# Patient Record
Sex: Female | Born: 1985
Health system: Southern US, Community
[De-identification: ages and names within clinical notes are randomized; demographics above are authoritative.]

## PROBLEM LIST (undated history)

## (undated) DIAGNOSIS — R102 Pelvic and perineal pain unspecified side: Secondary | ICD-10-CM

## (undated) DIAGNOSIS — F32A Depression, unspecified: Secondary | ICD-10-CM

## (undated) DIAGNOSIS — N939 Abnormal uterine and vaginal bleeding, unspecified: Secondary | ICD-10-CM

## (undated) DIAGNOSIS — G4730 Sleep apnea, unspecified: Secondary | ICD-10-CM

## (undated) DIAGNOSIS — G4733 Obstructive sleep apnea (adult) (pediatric): Secondary | ICD-10-CM

## (undated) DIAGNOSIS — F419 Anxiety disorder, unspecified: Secondary | ICD-10-CM

## (undated) DIAGNOSIS — N870 Mild cervical dysplasia: Secondary | ICD-10-CM

## (undated) DIAGNOSIS — D649 Anemia, unspecified: Secondary | ICD-10-CM

## (undated) DIAGNOSIS — D251 Intramural leiomyoma of uterus: Secondary | ICD-10-CM

## (undated) DIAGNOSIS — Z9889 Other specified postprocedural states: Secondary | ICD-10-CM

## (undated) HISTORY — DX: Depression, unspecified: F32.A

## (undated) HISTORY — DX: Sleep apnea, unspecified: G47.30

## (undated) HISTORY — DX: Anxiety disorder, unspecified: F41.9

## (undated) HISTORY — PX: DENTAL RESTORATION/EXTRACTION WITH X-RAY: SHX5796

---

## 2010-09-03 HISTORY — PX: TUBAL LIGATION: SHX77

## 2010-12-13 ENCOUNTER — Observation Stay: Payer: Self-pay | Admitting: Obstetrics & Gynecology

## 2010-12-20 ENCOUNTER — Inpatient Hospital Stay: Payer: Self-pay

## 2010-12-25 LAB — PATHOLOGY REPORT

## 2011-03-15 ENCOUNTER — Emergency Department: Payer: Self-pay | Admitting: Emergency Medicine

## 2013-05-19 ENCOUNTER — Emergency Department: Payer: Self-pay | Admitting: Emergency Medicine

## 2013-05-30 ENCOUNTER — Emergency Department: Payer: Self-pay | Admitting: Emergency Medicine

## 2014-12-24 ENCOUNTER — Emergency Department
Admit: 2014-12-24 | Disposition: A | Discharge status: Home / Self Care | Payer: Self-pay | Admitting: Emergency Medicine

## 2014-12-24 LAB — URINALYSIS, COMPLETE
BILIRUBIN, UR: NEGATIVE
Glucose,UR: NEGATIVE mg/dL (ref 0–75)
KETONE: NEGATIVE
Nitrite: NEGATIVE
PROTEIN: NEGATIVE
Ph: 7 (ref 4.5–8.0)
SPECIFIC GRAVITY: 1.016 (ref 1.003–1.030)

## 2014-12-24 LAB — PREGNANCY, URINE: PREGNANCY TEST, URINE: NEGATIVE m[IU]/mL

## 2014-12-24 LAB — WET PREP, GENITAL

## 2014-12-25 LAB — GC/CHLAMYDIA PROBE AMP

## 2016-03-15 ENCOUNTER — Encounter: Payer: Self-pay | Admitting: Emergency Medicine

## 2016-03-15 ENCOUNTER — Emergency Department
Admission: EM | Admit: 2016-03-15 | Discharge: 2016-03-15 | Disposition: A | Discharge status: Home / Self Care | Payer: Managed Care, Other (non HMO) | Attending: Emergency Medicine | Admitting: Emergency Medicine

## 2016-03-15 DIAGNOSIS — F17210 Nicotine dependence, cigarettes, uncomplicated: Secondary | ICD-10-CM | POA: Insufficient documentation

## 2016-03-15 DIAGNOSIS — N938 Other specified abnormal uterine and vaginal bleeding: Secondary | ICD-10-CM | POA: Insufficient documentation

## 2016-03-15 DIAGNOSIS — N939 Abnormal uterine and vaginal bleeding, unspecified: Secondary | ICD-10-CM | POA: Diagnosis present

## 2016-03-15 LAB — COMPREHENSIVE METABOLIC PANEL
ALBUMIN: 3.9 g/dL (ref 3.5–5.0)
ALK PHOS: 81 U/L (ref 38–126)
ALT: 13 U/L — AB (ref 14–54)
AST: 19 U/L (ref 15–41)
Anion gap: 6 (ref 5–15)
BUN: 12 mg/dL (ref 6–20)
CALCIUM: 9 mg/dL (ref 8.9–10.3)
CO2: 28 mmol/L (ref 22–32)
CREATININE: 0.69 mg/dL (ref 0.44–1.00)
Chloride: 106 mmol/L (ref 101–111)
GFR calc Af Amer: >60 mL/min (ref >60)
GFR calc non Af Amer: >60 mL/min (ref >60)
GLUCOSE: 89 mg/dL (ref 65–99)
Potassium: 3.6 mmol/L (ref 3.5–5.1)
SODIUM: 140 mmol/L (ref 135–145)
Total Bilirubin: 0.3 mg/dL (ref 0.3–1.2)
Total Protein: 6.7 g/dL (ref 6.5–8.1)

## 2016-03-15 LAB — URINALYSIS COMPLETE WITH MICROSCOPIC (ARMC ONLY)
Bacteria, UA: NONE SEEN
Bilirubin Urine: NEGATIVE
Glucose, UA: NEGATIVE mg/dL
KETONES UR: NEGATIVE mg/dL
Leukocytes, UA: NEGATIVE
Nitrite: NEGATIVE
PROTEIN: NEGATIVE mg/dL
Specific Gravity, Urine: 1.019 (ref 1.005–1.030)
WBC UA: NONE SEEN WBC/hpf (ref 0–5)
pH: 6 (ref 5.0–8.0)

## 2016-03-15 LAB — LIPASE, BLOOD: Lipase: 21 U/L (ref 11–51)

## 2016-03-15 LAB — CBC
HCT: 37.8 % (ref 35.0–47.0)
HEMOGLOBIN: 12.4 g/dL (ref 12.0–16.0)
MCH: 28.6 pg (ref 26.0–34.0)
MCHC: 32.9 g/dL (ref 32.0–36.0)
MCV: 86.8 fL (ref 80.0–100.0)
PLATELETS: 296 10*3/uL (ref 150–440)
RBC: 4.35 MIL/uL (ref 3.80–5.20)
RDW: 15.4 % — AB (ref 11.5–14.5)
WBC: 11 10*3/uL (ref 3.6–11.0)

## 2016-03-15 LAB — POCT PREGNANCY, URINE: Preg Test, Ur: NEGATIVE

## 2016-03-15 NOTE — ED Notes (Addendum)
Pt ambulatory to triage with steady gait, pt reports having mid abdominal pain today and spotting today. Pt reports LMP was 03/09/16. States fell on Monday, pt states unsure if symptoms related to fall. Pt denies urinary symptoms, nausea or vomiting. Pt alert and oriented x 4, no increased work in breathing noted.

## 2016-03-15 NOTE — ED Provider Notes (Signed)
Time Seen: Approximately 2030  I have reviewed the triage notes  Chief Complaint: Abdominal Pain and Vaginal Bleeding   History of Present Illness: Lynn Zuniga is a 30 y.o. female *who apparently had a slip and fall and was seen at an urgent care center and was diagnosed with a fracture of her coccyx. Patient was concerned today because she had some lower middle quadrant abdominal pain and some vaginal spotting. She states her menstrual period was 03/09/16. She denies any heavy vaginal bleeding. She denies any dysuria, hematuria or urinary frequency. She has had normal bowel movements. Pain in her lower back mainly with movement which is unchanged. She denies any weakness in her lower extremities.  History reviewed. No pertinent past medical history.  There are no active problems to display for this patient.   Past Surgical History  Procedure Laterality Date  . Tubal ligation      Past Surgical History  Procedure Laterality Date  . Tubal ligation      No current outpatient prescriptions on file.  Allergies:  Review of patient's allergies indicates no known allergies.  Family History: No family history on file.  Social History: Social History  Substance Use Topics  . Smoking status: Current Every Day Smoker -- 0.50 packs/day    Types: Cigarettes  . Smokeless tobacco: None  . Alcohol Use: Yes     Comment: rarely     Review of Systems:   10 point review of systems was performed and was otherwise negative:  Constitutional: No fever Eyes: No visual disturbances ENT: No sore throat, ear pain Cardiac: No chest pain Respiratory: No shortness of breath, wheezing, or stridor Abdomen: No abdominal pain, no vomiting, No diarrhea Endocrine: No weight loss, No night sweats Extremities: No peripheral edema, cyanosis Skin: No rashes, easy bruising Neurologic: No focal weakness, trouble with speech or swollowing Urologic: No dysuria, Hematuria, or urinary  frequency Patient denies any rectal bleeding  Physical Exam:  ED Triage Vitals  Enc Vitals Group     BP 03/15/16 1927 125/67 mmHg     Pulse Rate 03/15/16 1927 79     Resp 03/15/16 1927 18     Temp 03/15/16 1927 98.6 F (37 C)     Temp Source 03/15/16 1927 Oral     SpO2 03/15/16 1927 100 %     Weight 03/15/16 1927 185 lb (83.915 kg)     Height 03/15/16 1927 5\' 5"  (1.651 m)     Head Cir --      Peak Flow --      Pain Score 03/15/16 1927 5     Pain Loc --      Pain Edu? --      Excl. in GC? --     General: Awake , Alert , and Oriented times 3; GCS 15 Head: Normal cephalic , atraumatic Eyes: Pupils equal , round, reactive to light Nose/Throat: No nasal drainage, patent upper airway without erythema or exudate.  Neck: Supple, Full range of motion, No anterior adenopathy or palpable thyroid masses Lungs: Clear to ascultation without wheezes , rhonchi, or rales Heart: Regular rate, regular rhythm without murmurs , gallops , or rubs Abdomen: Soft, non tender without rebound, guarding , or rigidity; bowel sounds positive and symmetric in all 4 quadrants. No organomegaly .        Extremities: 2 plus symmetric pulses. No edema, clubbing or cyanosis Neurologic: normal ambulation, Motor symmetric without deficits, sensory intact Skin: warm, dry, no rashes  Labs:   All laboratory work was reviewed including any pertinent negatives or positives listed below:  Labs Reviewed  COMPREHENSIVE METABOLIC PANEL - Abnormal; Notable for the following:    ALT 13 (*)    All other components within normal limits  CBC - Abnormal; Notable for the following:    RDW 15.4 (*)    All other components within normal limits  URINALYSIS COMPLETEWITH MICROSCOPIC ([facility] ONLY) - Abnormal; Notable for the following:    Color, Urine YELLOW (*)    APPearance CLEAR (*)    Hgb urine dipstick 2+ (*)    Squamous Epithelial / LPF 0-5 (*)    All other components within normal limits  LIPASE, BLOOD  POC URINE  PREG, ED  POCT PREGNANCY, URINE    ED Course:  I felt was unlikely the patient had vaginal bleeding related to her fall. I felt these were 2 separate entities at this time. She denies any rectal bleeding which would be of concern if the patient fell injuring her coccyx that would cause the bone to drive through into the rectal area. This will take a pretty significant mechanism of her fall. I felt that that was unlikely. Patient's been up and ambulatory without difficulty and doesn't appear to have significant reproducible abdominal pain and I felt this was unlikely to be a surgical abdomen at this point.  Assessment:  Dysfunctional uterine bleeding Stated history of a coccyx fracture  Final Clinical Impression:   Final diagnoses:  DUB (dysfunctional uterine bleeding)     Plan:  Outpatient Patient was advised to return immediately if condition worsens. Patient was advised to follow up with their primary care physician or other specialized physicians involved in their outpatient care. The patient and/or family member/power of attorney had laboratory results reviewed at the bedside. All questions and concerns were addressed and appropriate discharge instructions were distributed by the nursing staff. Patient was advised take stool softeners and has prescription pain medication. She has been taking oxycodone and nonsteroidal medication which may actually be causing some of the abdominal discomfort            Jennye Moccasin, MD 03/15/16 2213

## 2016-03-15 NOTE — Discharge Instructions (Signed)
Abnormal Uterine Bleeding °Abnormal uterine bleeding can affect women at various stages in life, including teenagers, women in their reproductive years, pregnant women, and women who have reached menopause. Several kinds of uterine bleeding are considered abnormal, including: °· Bleeding or spotting between periods.   °· Bleeding after sexual intercourse.   °· Bleeding that is heavier or more than normal.   °· Periods that last longer than usual. °· Bleeding after menopause.   °Many cases of abnormal uterine bleeding are minor and simple to treat, while others are more serious. Any type of abnormal bleeding should be evaluated by your health care provider. Treatment will depend on the cause of the bleeding. °HOME CARE INSTRUCTIONS °Monitor your condition for any changes. The following actions may help to alleviate any discomfort you are experiencing: °· Avoid the use of tampons and douches as directed by your health care provider. °· Change your pads frequently. °You should get regular pelvic exams and Pap tests. Keep all follow-up appointments for diagnostic tests as directed by your health care provider.  °SEEK MEDICAL CARE IF:  °· Your bleeding lasts more than 1 week.   °· You feel dizzy at times.   °SEEK IMMEDIATE MEDICAL CARE IF:  °· You pass out.   °· You are changing pads every 15 to 30 minutes.   °· You have abdominal pain. °· You have a fever.   °· You become sweaty or weak.   °· You are passing large blood clots from the vagina.   °· You start to feel nauseous and vomit. °MAKE SURE YOU:  °· Understand these instructions. °· Will watch your condition. °· Will get help right away if you are not doing well or get worse. °  °This information is not intended to replace advice given to you by your health care provider. Make sure you discuss any questions you have with your health care provider. °  °Document Released: 08/20/2005 Document Revised: 08/25/2013 Document Reviewed: 03/19/2013 °Elsevier Interactive  Patient Education ©2016 [employer] ° °Please return immediately if condition worsens. Please contact her primary physician or the physician you were given for referral. If you have any specialist physicians involved in her treatment and plan please also contact them. Thank you for using [facility]. ° °

## 2016-03-21 ENCOUNTER — Encounter: Payer: Self-pay | Admitting: *Deleted

## 2016-03-21 ENCOUNTER — Emergency Department
Admission: EM | Admit: 2016-03-21 | Discharge: 2016-03-21 | Disposition: A | Discharge status: Home / Self Care | Payer: Managed Care, Other (non HMO) | Attending: Emergency Medicine | Admitting: Emergency Medicine

## 2016-03-21 ENCOUNTER — Emergency Department: Payer: Managed Care, Other (non HMO)

## 2016-03-21 DIAGNOSIS — Z791 Long term (current) use of non-steroidal anti-inflammatories (NSAID): Secondary | ICD-10-CM | POA: Insufficient documentation

## 2016-03-21 DIAGNOSIS — N939 Abnormal uterine and vaginal bleeding, unspecified: Secondary | ICD-10-CM | POA: Diagnosis not present

## 2016-03-21 DIAGNOSIS — F17210 Nicotine dependence, cigarettes, uncomplicated: Secondary | ICD-10-CM | POA: Diagnosis not present

## 2016-03-21 DIAGNOSIS — Z79899 Other long term (current) drug therapy: Secondary | ICD-10-CM | POA: Insufficient documentation

## 2016-03-21 DIAGNOSIS — R102 Pelvic and perineal pain: Secondary | ICD-10-CM | POA: Diagnosis present

## 2016-03-21 LAB — CBC
HCT: 35.4 % (ref 35.0–47.0)
Hemoglobin: 12.4 g/dL (ref 12.0–16.0)
MCH: 29.5 pg (ref 26.0–34.0)
MCHC: 35 g/dL (ref 32.0–36.0)
MCV: 84.4 fL (ref 80.0–100.0)
PLATELETS: 273 10*3/uL (ref 150–440)
RBC: 4.19 MIL/uL (ref 3.80–5.20)
RDW: 15.4 % — ABNORMAL HIGH (ref 11.5–14.5)
WBC: 9.1 10*3/uL (ref 3.6–11.0)

## 2016-03-21 LAB — POCT PREGNANCY, URINE: PREG TEST UR: NEGATIVE

## 2016-03-21 MED ORDER — MEDROXYPROGESTERONE ACETATE 10 MG PO TABS: 10.0000 mg | ORAL_TABLET | Freq: Every day | ORAL | Status: DC

## 2016-03-21 NOTE — ED Notes (Signed)
States she was seen last week with vaginal bleeding, states she is passing clots, denies any dizziness

## 2016-03-21 NOTE — Discharge Instructions (Signed)

## 2016-03-21 NOTE — ED Provider Notes (Signed)
[facility] Emergency Department Provider Note        Time seen: ----------------------------------------- 10:27 AM on 03/21/2016 -----------------------------------------    I have reviewed the triage vital signs and the nursing notes.   HISTORY  Chief Complaint Abdominal Pain and Vaginal Bleeding    HPI Lynn Zuniga is a 30 y.o. female who presents to ER for persistent abdominal pain and vaginal bleeding. Patient states she was recently seen for a coccyx fracture. She followed up with orthopedist recommended soft padding and pain medicine as needed. Patient states whenever she lies on her stomach she been passing clots. Patient states she had a menstrual cycle already this month but after the fall he began having bleeding again.   History reviewed. No pertinent past medical history.  There are no active problems to display for this patient.   Past Surgical History  Procedure Laterality Date  . Tubal ligation      Allergies Review of patient's allergies indicates no known allergies.  Social History Social History  Substance Use Topics  . Smoking status: Current Every Day Smoker -- 0.50 packs/day    Types: Cigarettes  . Smokeless tobacco: None  . Alcohol Use: Yes     Comment: rarely    Review of Systems Constitutional: Negative for fever. Cardiovascular: Negative for chest pain. Respiratory: Negative for shortness of breath. Gastrointestinal: Positive for abdominal pain Genitourinary: Positive for vaginal bleeding Musculoskeletal: Positive for pain in her tailbone Skin: Negative for rash. Neurological: Negative for headaches, focal weakness or numbness.  10-point ROS otherwise negative.  ____________________________________________   PHYSICAL EXAM:  VITAL SIGNS: ED Triage Vitals  Enc Vitals Group     BP 03/21/16 0954 124/74 mmHg     Pulse Rate 03/21/16 0954 82     Resp 03/21/16 0954 18     Temp 03/21/16 0954 98.4 F  (36.9 C)     Temp Source 03/21/16 0954 Oral     SpO2 03/21/16 0954 99 %     Weight 03/21/16 0954 185 lb (83.915 kg)     Height 03/21/16 0954  (1.651 m)     Head Cir --      Peak Flow --      Pain Score 03/21/16 0956 0     Pain Loc --      Pain Edu? --      Excl. in GC? --     Constitutional: Alert and oriented. Well appearing and in no distress. Eyes: Conjunctivae are normal. Normal extraocular movements. Cardiovascular: Normal rate, regular rhythm. No murmurs, rubs, or gallops. Respiratory: Normal respiratory effort without tachypnea nor retractions. Breath sounds are clear and equal bilaterally. No wheezes/rales/rhonchi. Gastrointestinal: Soft and nontender. Normal bowel sounds Musculoskeletal: Nontender with normal range of motion in all extremities. No lower extremity tenderness nor edema. Neurologic:  Normal speech and language. No gross focal neurologic deficits are appreciated.  Skin:  Skin is warm, dry and intact. No rash noted. Psychiatric: Mood and affect are normal. Speech and behavior are normal.  ____________________________________________  ED COURSE:  Pertinent labs & imaging results that were available during my care of the patient were reviewed by me and considered in my medical decision making (see chart for details). Patient is no acute distress, will check basic labs and likely ultrasound imaging. ____________________________________________    LABS (pertinent positives/negatives)  Labs Reviewed  CBC - Abnormal; Notable for the following:    RDW 15.4 (*)    All other components within normal limits  POCT PREGNANCY, URINE    RADIOLOGY  Pelvic ultrasound IMPRESSION: 1. Endometrial thickness is within normal limits for age. No focal endometrial abnormality identified. 2. Small anterior uterine fibroid. 3. Simple and mildly complex left ovarian cysts. No specific follow-up of these lesions is necessary. 4. No evidence of ovarian  torsion. ____________________________________________  FINAL ASSESSMENT AND PLAN  Abnormal vaginal bleeding  Plan: Patient with labs and imaging as dictated above. Patient with unremarkable ultrasound here. She will be given Provera and referred to GYN for outpatient follow-up.   Emily FilbertWilliams, Shekira Drummer E, MD   Note: This dictation was prepared with Dragon dictation. Any transcriptional errors that result from this process are unintentional   Emily FilbertJonathan E Jovi Zavadil, MD 03/21/16 1207

## 2016-03-21 NOTE — ED Notes (Signed)
Pt presents with abd pain and vaginal bleeding. Previous broken tailbone. Was seen here last week for same.

## 2016-04-02 DIAGNOSIS — S300XXA Contusion of lower back and pelvis, initial encounter: Secondary | ICD-10-CM | POA: Insufficient documentation

## 2016-11-19 ENCOUNTER — Ambulatory Visit (INDEPENDENT_AMBULATORY_CARE_PROVIDER_SITE_OTHER): Payer: Managed Care, Other (non HMO) | Admitting: Obstetrics & Gynecology

## 2016-11-19 ENCOUNTER — Encounter: Payer: Self-pay | Admitting: Obstetrics & Gynecology

## 2016-11-19 VITALS — BP 120/80 | HR 77 | Ht 66.0 in | Wt 184.0 lb

## 2016-11-19 DIAGNOSIS — N879 Dysplasia of cervix uteri, unspecified: Secondary | ICD-10-CM | POA: Insufficient documentation

## 2016-11-19 DIAGNOSIS — N920 Excessive and frequent menstruation with regular cycle: Secondary | ICD-10-CM | POA: Diagnosis not present

## 2016-11-19 HISTORY — DX: Excessive and frequent menstruation with regular cycle: N92.0

## 2016-11-19 NOTE — Progress Notes (Signed)
  Pt is a 31 yo [id number] with prior LGSIL and COlpo w Bx showing CIN I 6 mos ago.  No c/os.  Here for f/u.  Also has reg periods but some are more heavy than others; wearing tampon and pad hourly for a few days.  Interferes w functioning. Assoc w cramps.  Motrin mild relief.   PMHx: She  has no past medical history on file. Also,  has a past surgical history that includes Tubal ligation., family history includes Lung cancer in her maternal aunt.,  reports that she has been smoking Cigarettes.  She has been smoking about 0.50 packs per day. She has never used smokeless tobacco. She reports that she drinks alcohol.  She has a current medication list which includes the following prescription(s): medroxyprogesterone, naproxen, and oxycodone-acetaminophen. Also, has No Known Allergies.  Review of Systems  Constitutional: Negative for chills, fever and malaise/fatigue.  HENT: Negative for congestion, sinus pain and sore throat.   Eyes: Negative for blurred vision and pain.  Respiratory: Negative for cough and wheezing.   Cardiovascular: Negative for chest pain and leg swelling.  Gastrointestinal: Negative for abdominal pain, constipation, diarrhea, heartburn, nausea and vomiting.  Genitourinary: Negative for dysuria, frequency, hematuria and urgency.  Musculoskeletal: Negative for back pain, joint pain, myalgias and neck pain.  Skin: Negative for itching and rash.  Neurological: Negative for dizziness, tremors and weakness.  Endo/Heme/Allergies: Does not bruise/bleed easily.  Psychiatric/Behavioral: Negative for depression. The patient is not nervous/anxious and does not have insomnia.     Objective: BP 120/80   Pulse 77   Ht 5\' 6"  (1.676 m)   Wt 184 lb (83.5 kg)   LMP 10/28/2016   BMI 29.70 kg/m  Physical Exam  Constitutional: She is oriented to person, place, and time. She appears well-developed and well-nourished. No distress.  Genitourinary: Rectum normal and vagina normal. Pelvic exam  was performed with patient supine. There is no rash or lesion on the right labia. There is no rash or lesion on the left labia. Vagina exhibits no lesion. No bleeding in the vagina.  Cardiovascular: Normal rate.   Pulmonary/Chest: Effort normal.  Abdominal: Soft. Bowel sounds are normal. She exhibits no distension. There is no tenderness. There is no rebound.  Musculoskeletal: Normal range of motion.  Neurological: She is alert and oriented to person, place, and time.  Skin: Skin is warm and dry.  Psychiatric: She has a normal mood and affect.  Vitals reviewed.   ASSESSMENT/PLAN:    Problem List Items Addressed This Visit      Genitourinary   Cervical dysplasia - Primary   Relevant Orders   IGP, Aptima HPV     Other   Menorrhagia with regular cycle    Patient has abnormal uterine bleeding . She has a normal exam today, with no evidence of lesions. Future Evaluation includes the following: exam, labs such as hormonal testing, and pelvic ultrasound to evaluate for any structural gynecologic abnormalities.  She has had BTL.  Will consider.  Treatment option for menorrhagia or menometrorrhagia discussed in great detail with the patient.  Options include hormonal therapy, IUD therapy such as Mirena, D&C, Ablation, and Hysterectomy.  The pros and cons of each option discussed with patient.  PAP 6 mos.  Annamarie MajorPaul Harris, MD, Merlinda FrederickFACOG [facility], [facility] 11/19/2016  11:21 AM

## 2016-11-22 LAB — IGP, APTIMA HPV
HPV APTIMA: NEGATIVE
PAP SMEAR COMMENT: 0

## 2017-06-09 ENCOUNTER — Emergency Department
Admission: EM | Admit: 2017-06-09 | Discharge: 2017-06-09 | Disposition: A | Discharge status: Home / Self Care | Payer: Managed Care, Other (non HMO) | Attending: Emergency Medicine | Admitting: Emergency Medicine

## 2017-06-09 DIAGNOSIS — F17210 Nicotine dependence, cigarettes, uncomplicated: Secondary | ICD-10-CM | POA: Diagnosis not present

## 2017-06-09 DIAGNOSIS — K047 Periapical abscess without sinus: Secondary | ICD-10-CM | POA: Diagnosis not present

## 2017-06-09 DIAGNOSIS — K0889 Other specified disorders of teeth and supporting structures: Secondary | ICD-10-CM | POA: Diagnosis present

## 2017-06-09 MED ORDER — DEXAMETHASONE SODIUM PHOSPHATE 10 MG/ML IJ SOLN: 10.0000 mg | Freq: Once | INTRAMUSCULAR | Status: DC

## 2017-06-09 MED ORDER — LIDOCAINE VISCOUS 2 % MT SOLN: 10.0000 mL | OROMUCOSAL | 0 refills | Status: DC | PRN

## 2017-06-09 MED ORDER — DEXAMETHASONE SODIUM PHOSPHATE 10 MG/ML IJ SOLN
10.0000 mg | Freq: Once | INTRAMUSCULAR | Status: AC
  Administered 2017-06-09: 10 mg via INTRAMUSCULAR
  Filled 2017-06-09: qty 1

## 2017-06-09 MED ORDER — LIDOCAINE VISCOUS 2 % MT SOLN
15.0000 mL | Freq: Once | OROMUCOSAL | Status: AC
  Administered 2017-06-09: 15 mL via OROMUCOSAL
  Filled 2017-06-09: qty 15

## 2017-06-09 MED ORDER — CLINDAMYCIN PHOSPHATE 600 MG/50ML IV SOLN: 600.0000 mg | Freq: Once | INTRAVENOUS | Status: DC

## 2017-06-09 MED ORDER — CLINDAMYCIN PHOSPHATE 300 MG/2ML IJ SOLN
INTRAMUSCULAR | Status: AC
  Filled 2017-06-09: qty 4

## 2017-06-09 MED ORDER — CLINDAMYCIN PHOSPHATE 600 MG/4ML IJ SOLN
600.0000 mg | Freq: Once | INTRAMUSCULAR | Status: AC
  Administered 2017-06-09: 600 mg via INTRAMUSCULAR
  Filled 2017-06-09: qty 4

## 2017-06-09 MED ORDER — CLINDAMYCIN HCL 300 MG PO CAPS: 300.0000 mg | ORAL_CAPSULE | Freq: Three times a day (TID) | ORAL | 0 refills | Status: AC

## 2017-06-09 MED ORDER — IBUPROFEN 600 MG PO TABS: 600.0000 mg | ORAL_TABLET | Freq: Four times a day (QID) | ORAL | 0 refills | Status: DC | PRN

## 2017-06-09 MED ORDER — OXYCODONE-ACETAMINOPHEN 5-325 MG PO TABS
1.0000 | ORAL_TABLET | Freq: Once | ORAL | Status: AC
  Administered 2017-06-09: 1 via ORAL
  Filled 2017-06-09: qty 1

## 2017-06-09 NOTE — ED Triage Notes (Signed)
Pt c/o left sided dental pain since yesterday, unable to see a dentist. VS stable.

## 2017-06-09 NOTE — ED Provider Notes (Signed)
[facility] Emergency Department Provider Note  ____________________________________________  Time seen: Approximately 12:41 PM  I have reviewed the triage vital signs and the nursing notes.   HISTORY  Chief Complaint Dental Pain    HPI Lynn Zuniga is a 31 y.o. female that presents to emergency department for evaluation of left-sided dental pain for two days. She feels like it is swollen. She does not have a dentist. No fever, shortness of breath, chest pain, nausea, vomiting, abdominal pain.   History reviewed. No pertinent past medical history.  Patient Active Problem List   Diagnosis Date Noted  . Cervical dysplasia 11/19/2016  . Menorrhagia with regular cycle 11/19/2016    Past Surgical History:  Procedure Laterality Date  . TUBAL LIGATION      Prior to Admission medications   Medication Sig Start Date End Date Taking? Authorizing Provider  clindamycin (CLEOCIN) 300 MG capsule Take 1 capsule (300 mg total) by mouth 3 (three) times daily. 06/09/17 06/19/17  Enid Derry, PA-C  ibuprofen (ADVIL,MOTRIN) 600 MG tablet Take 1 tablet (600 mg total) by mouth every 6 (six) hours as needed. 06/09/17   Enid Derry, PA-C  lidocaine (XYLOCAINE) 2 % solution Use as directed 10 mLs in the mouth or throat as needed for mouth pain. 06/09/17   Enid Derry, PA-C  medroxyPROGESTERone (PROVERA) 10 MG tablet Take 1 tablet (10 mg total) by mouth daily. Patient not taking: Reported on 11/19/2016 03/21/16   Emily Filbert, MD  naproxen (NAPROSYN) 500 MG tablet Take 500 mg by mouth 2 (two) times daily with a meal.    [provider]  oxyCODONE-acetaminophen (PERCOCET/ROXICET) 5-325 MG tablet Take 1 tablet by mouth every 6 (six) hours as needed for severe pain.    [provider]    Allergies Patient has no known allergies.  Family History  Problem Relation Age of Onset  . Lung cancer Maternal Aunt     Social History Social History   Substance Use Topics  . Smoking status: Current Every Day Smoker    Packs/day: 0.50    Types: Cigarettes  . Smokeless tobacco: Never Used  . Alcohol use Yes     Comment: rarely     Review of Systems  Constitutional: No fever/chills Cardiovascular: No chest pain. Respiratory: No SOB. Gastrointestinal: No abdominal pain.  No nausea, no vomiting.  Musculoskeletal: Negative for musculoskeletal pain. Skin: Negative for rash, abrasions, lacerations, ecchymosis. Neurological: Negative for headaches, numbness or tingling   ____________________________________________   PHYSICAL EXAM:  VITAL SIGNS: ED Triage Vitals [06/09/17 1053]  Enc Vitals Group     BP (!) 148/63     Pulse Rate 77     Resp 16     Temp 98.6 F (37 C)     Temp Source Oral     SpO2 100 %     Weight 185 lb (83.9 kg)     Height  (1.676 m)     Head Circumference      Peak Flow      Pain Score      Pain Loc      Pain Edu?      Excl. in GC?      Constitutional: Alert and oriented. Well appearing and in no acute distress. Eyes: Conjunctivae are normal. PERRL. EOMI. Head: Atraumatic. ENT:      Ears:      Nose: No congestion/rhinnorhea.      Mouth/Throat: Mucous membranes are moist. Tenderness to palpation over left  lower lateral incisor. No visible dental decay. No visible swelling. No drainage from mouth. No TMJ pain. No difficulty opening and closing mouth. Neck: No stridor.  Cardiovascular: Normal rate, regular rhythm.  Good peripheral circulation. Respiratory: Normal respiratory effort without tachypnea or retractions. Lungs CTAB. Good air entry to the bases with no decreased or absent breath sounds. Musculoskeletal: Full range of motion to all extremities. No gross deformities appreciated. Neurologic:  Normal speech and language. No gross focal neurologic deficits are appreciated.  Skin:  Skin is warm, dry and intact. No rash noted.   ____________________________________________   LABS (all  labs ordered are listed, but only abnormal results are displayed)  Labs Reviewed - No data to display ____________________________________________  EKG   ____________________________________________  RADIOLOGY  No results found.  ____________________________________________    PROCEDURES  Procedure(s) performed:    Procedures    Medications  lidocaine (XYLOCAINE) 2 % viscous mouth solution 15 mL (15 mLs Mouth/Throat Given 06/09/17 1157)  oxyCODONE-acetaminophen (PERCOCET/ROXICET) 5-325 MG per tablet 1 tablet (1 tablet Oral Given 06/09/17 1157)  clindamycin (CLEOCIN) injection 600 mg (600 mg Intramuscular Given 06/09/17 1207)  dexamethasone (DECADRON) injection 10 mg (10 mg Intramuscular Given 06/09/17 1157)     ____________________________________________   INITIAL IMPRESSION / ASSESSMENT AND PLAN / ED COURSE  Pertinent labs & imaging results that were available during my care of the patient were reviewed by me and considered in my medical decision making (see chart for details).  Review of the [place] CSRS was performed in accordance of the [facility] prior to dispensing any controlled drugs.  Patient's diagnosis is consistent with dental abscess. Vital signs and exam are reassuring. IM clindamycin was given. Decadron was given for subjective swelling. Pain improved with viscous lidocaine. Patient will be discharged home with prescriptions for IM Clindamycin and viscous lidocaine. Patient is to follow up with dentist as directed. Dental resources was provided. Patient is given ED precautions to return to the ED for any worsening or new symptoms.     ____________________________________________  FINAL CLINICAL IMPRESSION(S) / ED DIAGNOSES  Final diagnoses:  Dental infection      NEW MEDICATIONS STARTED DURING THIS VISIT:  Discharge Medication List as of 06/09/2017 12:44 PM    START taking these medications   Details  clindamycin (CLEOCIN) 300 MG capsule Take 1  capsule (300 mg total) by mouth 3 (three) times daily., Starting Sun 06/09/2017, Until Wed 06/19/2017, Print    ibuprofen (ADVIL,MOTRIN) 600 MG tablet Take 1 tablet (600 mg total) by mouth every 6 (six) hours as needed., Starting Sun 06/09/2017, Print    lidocaine (XYLOCAINE) 2 % solution Use as directed 10 mLs in the mouth or throat as needed for mouth pain., Starting Sun 06/09/2017, Print            This chart was dictated using voice recognition software/Dragon. Despite best efforts to proofread, errors can occur which can change the meaning. Any change was purely unintentional.    Enid Derry, PA-C 06/09/17 1548    Emily Filbert, MD 06/10/17 [phone]

## 2017-06-09 NOTE — ED Notes (Signed)
See triage note. Presents with possible dental abscess   Developed left side gum/dental pain   Min swelling noted to area

## 2017-09-01 IMAGING — US US TRANSVAGINAL NON-OB
1 series · 13 of 25 positions shown · non-contrast
Comparison: None.

CLINICAL DATA: 30-year-old with pelvic pain and persistent vaginal
bleeding for 6 days. Negative urine pregnancy test. LMP 03/09/2016.

EXAM:
TRANSABDOMINAL AND TRANSVAGINAL ULTRASOUND OF PELVIS
DOPPLER ULTRASOUND OF OVARIES
TECHNIQUE: Both transabdominal and transvaginal ultrasound examinations of the
pelvis were performed. Transabdominal technique was performed for
global imaging of the pelvis including uterus, ovaries, adnexal
regions, and pelvic cul-de-sac.
It was necessary to proceed with endovaginal exam following the
transabdominal exam to visualize the ovaries to better advantage.
Color and duplex Doppler ultrasound was utilized to evaluate blood
flow to the ovaries.

[Series 1: us transvaginal non-ob · 0.24mm/px · 13 of 106 slices shown]
[im 1/106]
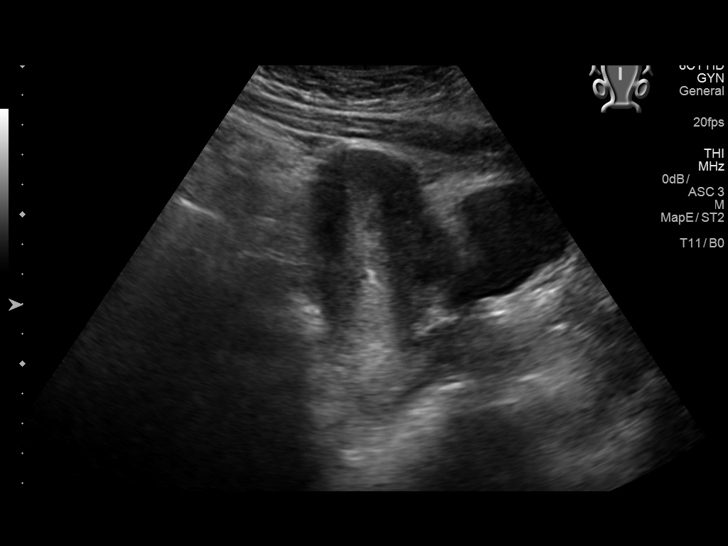
[im 9/106]
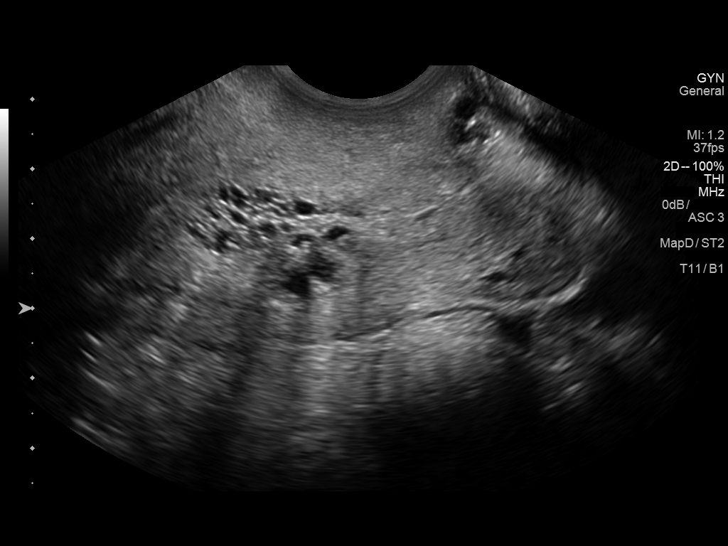
[im 18/106]
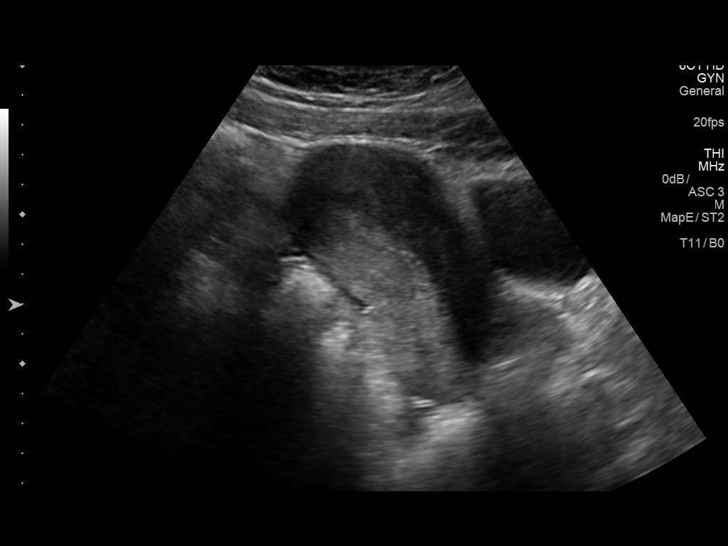
[im 27/106]
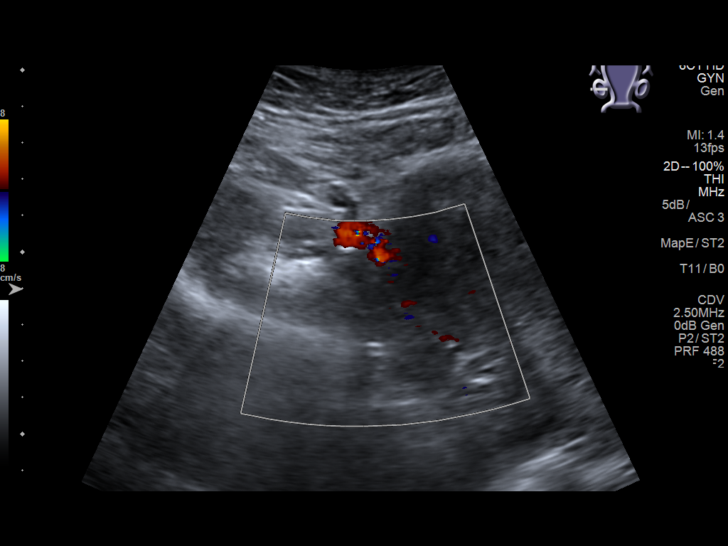
[im 36/106]
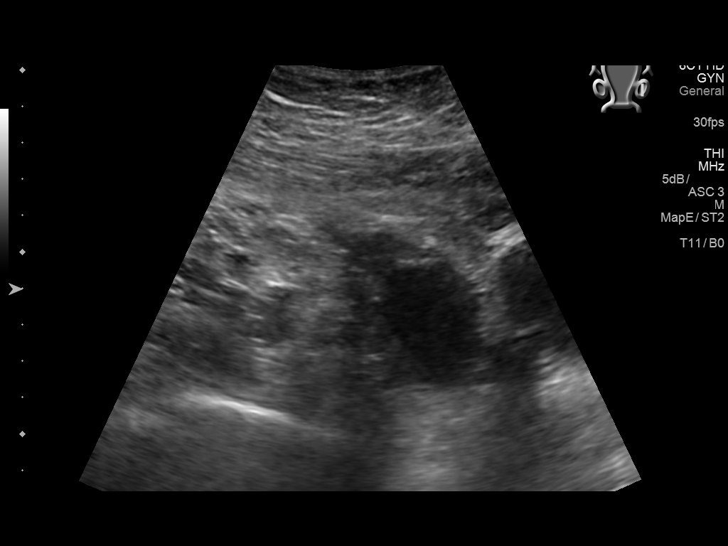
[im 44/106]
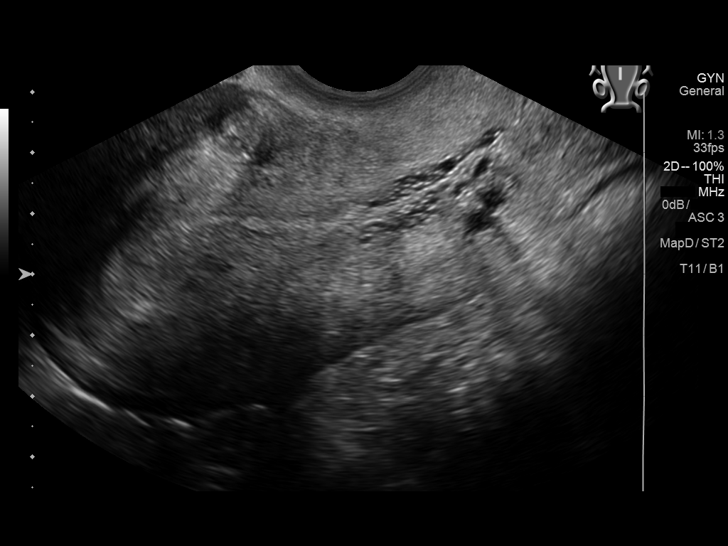
[im 53/106]
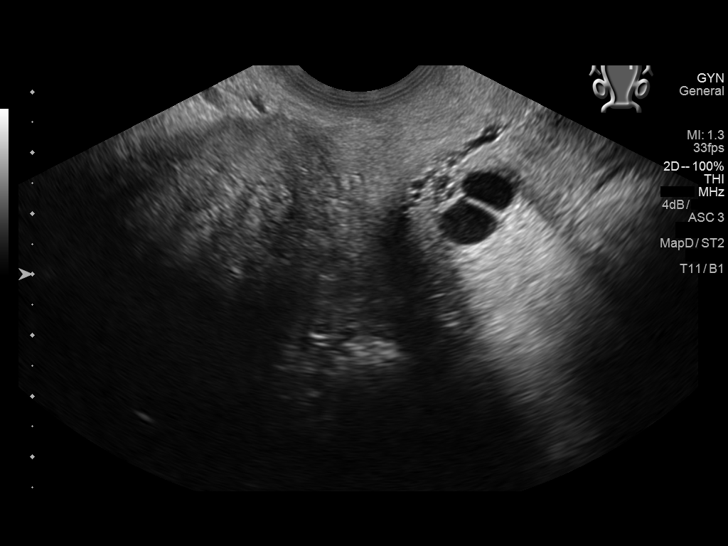
[im 62/106]
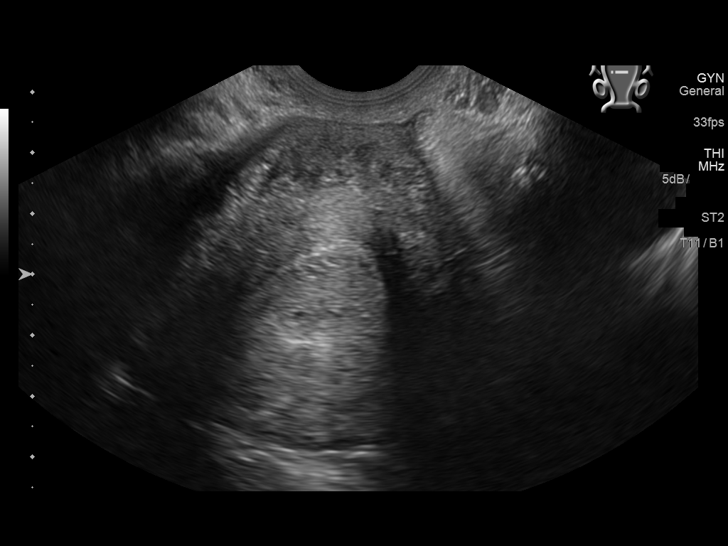
[im 71/106]
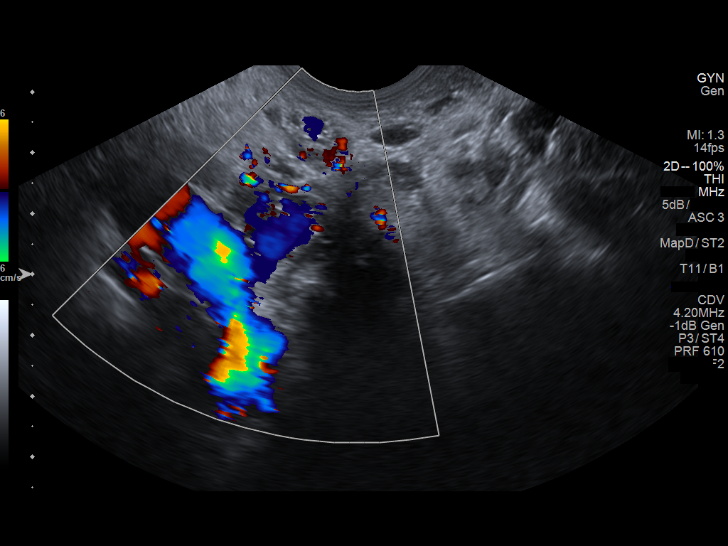
[im 79/106]
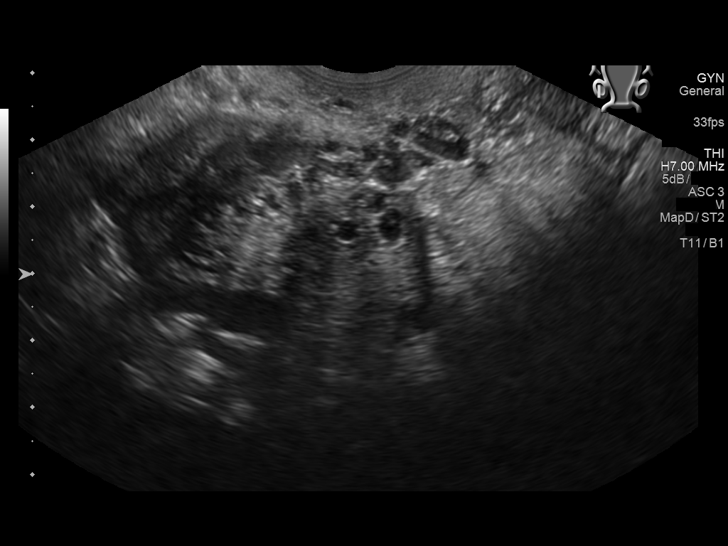
[im 88/106]
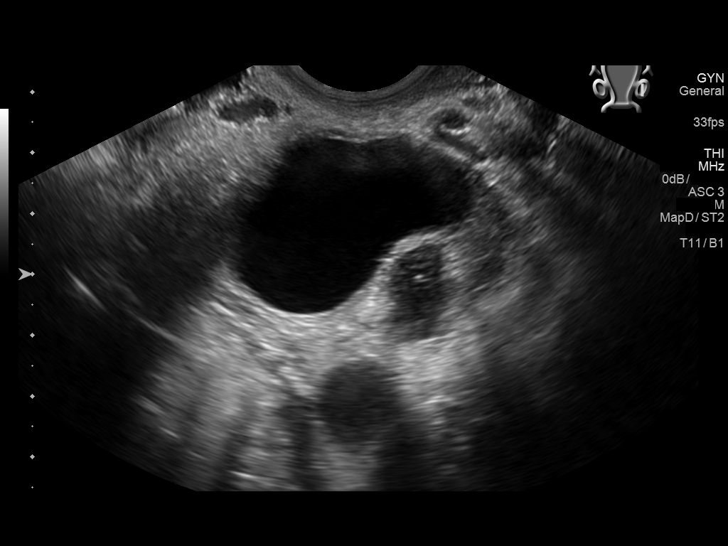
[im 97/106]
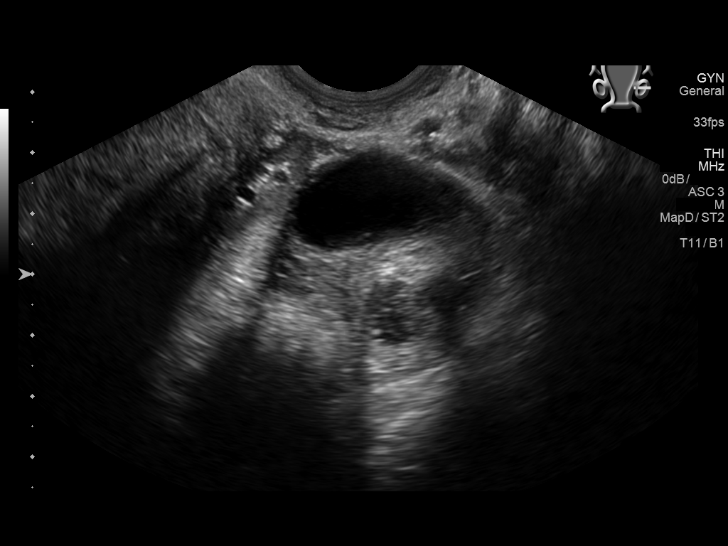
[im 106/106]
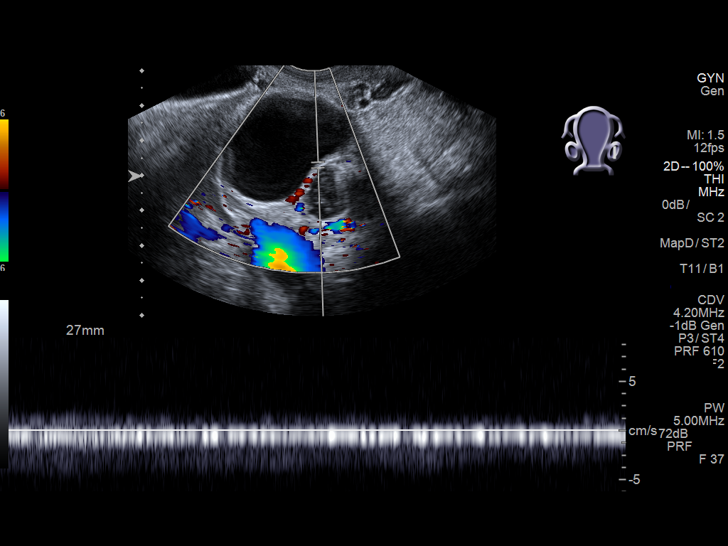

[13 of 25 positions shown; findings below may reference images not displayed]

FINDINGS: Uterus

Measurements: 9.3 x 3.9 x 5.5 cm. There is a small anterior uterine
fibroid measuring 11 x 13 x 14 mm. No other myometrial
abnormalities.

Endometrium

Thickness: 16 mm.  No focal abnormality visualized.

Right ovary

Measurements: 3.3 x 3.3 x 2.3 cm. Normal appearance/no adnexal mass.
Normal blood flow with color Doppler.

Left ovary

Measurements: 5.2 x 3.3 x 3.7 cm. There is a dominant simple
appearing cystic lesion in the left ovary, measuring 3.4 x 2.8 x
cm. There is an adjacent smaller mildly complex cystic lesion,
measuring 1.5 x 1.6 x 1.2 cm with mobile debris. Normal blood flow
with color Doppler.

Pulsed Doppler evaluation of both ovaries demonstrates normal
low-resistance arterial and venous waveforms.

Other findings

Trace free pelvic fluid.
IMPRESSION: 1. Endometrial thickness is within normal limits for age. No focal
endometrial abnormality identified.
2. Small anterior uterine fibroid.
3. Simple and mildly complex left ovarian cysts. No specific
follow-up of these lesions is necessary.
4. No evidence of ovarian torsion.

## 2018-08-21 ENCOUNTER — Emergency Department
Admission: EM | Admit: 2018-08-21 | Discharge: 2018-08-21 | Disposition: A | Discharge status: Home / Self Care | Payer: Managed Care, Other (non HMO) | Attending: Emergency Medicine | Admitting: Emergency Medicine

## 2018-08-21 ENCOUNTER — Other Ambulatory Visit: Payer: Self-pay

## 2018-08-21 DIAGNOSIS — S40862A Insect bite (nonvenomous) of left upper arm, initial encounter: Secondary | ICD-10-CM | POA: Diagnosis not present

## 2018-08-21 DIAGNOSIS — Y929 Unspecified place or not applicable: Secondary | ICD-10-CM | POA: Insufficient documentation

## 2018-08-21 DIAGNOSIS — Y999 Unspecified external cause status: Secondary | ICD-10-CM | POA: Insufficient documentation

## 2018-08-21 DIAGNOSIS — Y939 Activity, unspecified: Secondary | ICD-10-CM | POA: Diagnosis not present

## 2018-08-21 DIAGNOSIS — W57XXXA Bitten or stung by nonvenomous insect and other nonvenomous arthropods, initial encounter: Secondary | ICD-10-CM | POA: Insufficient documentation

## 2018-08-21 DIAGNOSIS — F17210 Nicotine dependence, cigarettes, uncomplicated: Secondary | ICD-10-CM | POA: Insufficient documentation

## 2018-08-21 DIAGNOSIS — Z79899 Other long term (current) drug therapy: Secondary | ICD-10-CM | POA: Diagnosis not present

## 2018-08-21 DIAGNOSIS — L539 Erythematous condition, unspecified: Secondary | ICD-10-CM | POA: Diagnosis present

## 2018-08-21 MED ORDER — HYDROCORTISONE 1 % EX LOTN: 1.0000 "application " | TOPICAL_LOTION | Freq: Two times a day (BID) | CUTANEOUS | 0 refills | Status: DC

## 2018-08-21 MED ORDER — DEXAMETHASONE SODIUM PHOSPHATE 10 MG/ML IJ SOLN
10.0000 mg | Freq: Once | INTRAMUSCULAR | Status: AC
  Administered 2018-08-21: 10 mg via INTRAMUSCULAR
  Filled 2018-08-21: qty 1

## 2018-08-21 NOTE — ED Provider Notes (Signed)
[facility] Emergency Department Provider Note  ____________________________________________  Time seen: Approximately 10:18 PM  I have reviewed the triage vital signs and the nursing notes.   HISTORY  Chief Complaint Insect Bite    HPI Lynn Zuniga is a 32 y.o. female presents to the emergency department with localized erythema along the lateral aspect of the left upper arm after being stung by an insect during the midmorning.  Patient describes region as being pruritic.  Patient states that localized erythema has not improved throughout the day and she became concerned.  No numbness or tingling in the left upper extremity.  Her tetanus status is up-to-date.  No alleviating measures have been attempted.   History reviewed. No pertinent past medical history.  Patient Active Problem List   Diagnosis Date Noted  . Cervical dysplasia 11/19/2016  . Menorrhagia with regular cycle 11/19/2016    Past Surgical History:  Procedure Laterality Date  . DENTAL RESTORATION/EXTRACTION WITH X-RAY    . TUBAL LIGATION      Prior to Admission medications   Medication Sig Start Date End Date Taking? Authorizing Provider  hydrocortisone 1 % lotion Apply 1 application topically 2 (two) times daily. 08/21/18   Orvil FeilWoods, Alyxander Kollmann M, PA-C  ibuprofen (ADVIL,MOTRIN) 600 MG tablet Take 1 tablet (600 mg total) by mouth every 6 (six) hours as needed. 06/09/17   Enid DerryWagner, Ashley, PA-C  lidocaine (XYLOCAINE) 2 % solution Use as directed 10 mLs in the mouth or throat as needed for mouth pain. 06/09/17   Enid DerryWagner, Ashley, PA-C  medroxyPROGESTERone (PROVERA) 10 MG tablet Take 1 tablet (10 mg total) by mouth daily. Patient not taking: Reported on 11/19/2016 03/21/16   Emily FilbertWilliams, Jonathan E, MD  naproxen (NAPROSYN) 500 MG tablet Take 500 mg by mouth 2 (two) times daily with a meal.    [provider]  oxyCODONE-acetaminophen (PERCOCET/ROXICET) 5-325 MG tablet Take 1 tablet by mouth every 6  (six) hours as needed for severe pain.    [provider]    Allergies Patient has no known allergies.  Family History  Problem Relation Age of Onset  . Lung cancer Maternal Aunt     Social History Social History   Tobacco Use  . Smoking status: Current Every Day Smoker    Packs/day: 0.50    Types: Cigarettes  . Smokeless tobacco: Never Used  Substance Use Topics  . Alcohol use: Yes    Comment: rarely  . Drug use: Not on file     Review of Systems  Constitutional: No fever/chills Eyes: No visual changes. No discharge ENT: No upper respiratory complaints. Cardiovascular: no chest pain. Respiratory: no cough. No SOB. Gastrointestinal: No abdominal pain.  No nausea, no vomiting.  No diarrhea.  No constipation. Musculoskeletal: Negative for musculoskeletal pain. Skin: Patient has insect bite of left upper arm. Neurological: Negative for headaches, focal weakness or numbness.   ____________________________________________   PHYSICAL EXAM:  VITAL SIGNS: ED Triage Vitals  Enc Vitals Group     BP 08/21/18 2015 126/63     Pulse Rate 08/21/18 2015 78     Resp 08/21/18 2208 18     Temp 08/21/18 2015 97.9 F (36.6 C)     Temp Source 08/21/18 2208 Oral     SpO2 08/21/18 2015 99 %     Weight 08/21/18 2012 180 lb (81.6 kg)     Height 08/21/18 2012 5\' 6"  (1.676 m)     Head Circumference --      Peak  Flow --      Pain Score 08/21/18 2012 0     Pain Loc --      Pain Edu? --      Excl. in GC? --      Constitutional: Alert and oriented. Well appearing and in no acute distress. Eyes: Conjunctivae are normal. PERRL. EOMI. Head: Atraumatic.  Cardiovascular: Normal rate, regular rhythm. Normal S1 and S2.  Good peripheral circulation. Respiratory: Normal respiratory effort without tachypnea or retractions. Lungs CTAB. Good air entry to the bases with no decreased or absent breath sounds. Musculoskeletal: Full range of motion to all extremities. No gross  deformities appreciated. Neurologic:  Normal speech and language. No gross focal neurologic deficits are appreciated.  Skin: Patient has localized erythema along left upper arm that is circumferential with a sting site in the center. Psychiatric: Mood and affect are normal. Speech and behavior are normal. Patient exhibits appropriate insight and judgement.   ____________________________________________   LABS (all labs ordered are listed, but only abnormal results are displayed)  Labs Reviewed - No data to display ____________________________________________  EKG   ____________________________________________  RADIOLOGY   No results found.  ____________________________________________    PROCEDURES  Procedure(s) performed:    Procedures    Medications  dexamethasone (DECADRON) injection 10 mg (10 mg Intramuscular Given 08/21/18 2133)     ____________________________________________   INITIAL IMPRESSION / ASSESSMENT AND PLAN / ED COURSE  Pertinent labs & imaging results that were available during my care of the patient were reviewed by me and considered in my medical decision making (see chart for details).  Review of the [place] CSRS was performed in accordance of the [facility] prior to dispensing any controlled drugs.      Assessment and plan Insect sting Patient presents to the emergency department with localized erythema from an insect sting earlier today.  Patient was given an injection of Decadron in the emergency department and was discharged with hydrocortisone ointment.  Strict return precautions were given to return for new or worsening symptoms.  All patient questions were answered.   ____________________________________________  FINAL CLINICAL IMPRESSION(S) / ED DIAGNOSES  Final diagnoses:  Insect bite, unspecified site, initial encounter      NEW MEDICATIONS STARTED DURING THIS VISIT:  ED Discharge Orders         Ordered    hydrocortisone 1 %  lotion  2 times daily     08/21/18 2123              This chart was dictated using voice recognition software/Dragon. Despite best efforts to proofread, errors can occur which can change the meaning. Any change was purely unintentional.    Orvil FeilWoods, Taliyah Watrous M, PA-C 08/21/18 2222    Arnaldo NatalMalinda, Paul F, MD 08/22/18 [phone]

## 2018-08-21 NOTE — ED Triage Notes (Signed)
Patient c/o possible insect bite at 1000 today. Area of redness, approximately 4" seen on inspection.

## 2018-08-21 NOTE — ED Notes (Signed)
Pt states was at work and felt a "stinging" to left upper arm and noted a red, swollen area. States does itch, has had the same before but not as big. States never saw insect, no meds taken prior to arrival.

## 2019-04-30 ENCOUNTER — Other Ambulatory Visit: Payer: Self-pay

## 2019-04-30 DIAGNOSIS — Z20822 Contact with and (suspected) exposure to covid-19: Secondary | ICD-10-CM

## 2019-05-01 LAB — NOVEL CORONAVIRUS, NAA: SARS-CoV-2, NAA: NOT DETECTED

## 2020-09-03 HISTORY — PX: ENDOMETRIAL ABLATION: SHX621

## 2020-11-22 ENCOUNTER — Encounter: Payer: Self-pay | Admitting: Obstetrics & Gynecology

## 2020-11-22 ENCOUNTER — Other Ambulatory Visit: Payer: Self-pay

## 2020-11-22 ENCOUNTER — Other Ambulatory Visit (HOSPITAL_COMMUNITY)
Admission: RE | Admit: 2020-11-22 | Discharge: 2020-11-22 | Disposition: A | Discharge status: Home / Self Care | Payer: Managed Care, Other (non HMO) | Source: Ambulatory Visit | Attending: Obstetrics & Gynecology | Admitting: Obstetrics & Gynecology

## 2020-11-22 ENCOUNTER — Other Ambulatory Visit (HOSPITAL_COMMUNITY)
Admission: RE | Admit: 2020-11-22 | Discharge: 2020-11-22 | Disposition: A | Discharge status: Home / Self Care | Payer: Medicaid Other | Source: Ambulatory Visit | Attending: Obstetrics & Gynecology | Admitting: Obstetrics & Gynecology

## 2020-11-22 ENCOUNTER — Ambulatory Visit (INDEPENDENT_AMBULATORY_CARE_PROVIDER_SITE_OTHER): Payer: BC Managed Care – PPO | Admitting: Obstetrics & Gynecology

## 2020-11-22 VITALS — BP 100/70 | Ht 66.0 in | Wt 196.0 lb

## 2020-11-22 DIAGNOSIS — Z124 Encounter for screening for malignant neoplasm of cervix: Secondary | ICD-10-CM | POA: Diagnosis not present

## 2020-11-22 DIAGNOSIS — Z01419 Encounter for gynecological examination (general) (routine) without abnormal findings: Secondary | ICD-10-CM | POA: Diagnosis not present

## 2020-11-22 DIAGNOSIS — N870 Mild cervical dysplasia: Secondary | ICD-10-CM | POA: Insufficient documentation

## 2020-11-22 DIAGNOSIS — N920 Excessive and frequent menstruation with regular cycle: Secondary | ICD-10-CM | POA: Insufficient documentation

## 2020-11-22 DIAGNOSIS — Z1329 Encounter for screening for other suspected endocrine disorder: Secondary | ICD-10-CM | POA: Diagnosis not present

## 2020-11-22 DIAGNOSIS — Z131 Encounter for screening for diabetes mellitus: Secondary | ICD-10-CM

## 2020-11-22 DIAGNOSIS — Z13220 Encounter for screening for lipoid disorders: Secondary | ICD-10-CM | POA: Diagnosis not present

## 2020-11-22 NOTE — Patient Instructions (Addendum)
PAP every year  Thank you for choosing [facility]. As part of our ongoing efforts to improve patient experience, we would appreciate your feedback. Please fill out the short survey that you will receive by mail or MyChart. Your opinion is important to [place]! - Dr. Tiburcio Pea  Primary Care in the area  This is not meant to be comprehensive list, but a resource for some providers that you can call for counseling or medication needs. If you have a recommendation, please let us know.   [facility]:  [phone]               Dr Julieanne Manson               Dr Jamse [place]               Dr Shirlee Latch  [facility]:  [phone]  Dr Danna Hefty [facility], [place]:   [phone]  Hannah Beat, MD  Gweneth Dimitri, MD  Eustaquio Boyden, MD  Excell Seltzer, MD  [facility] [place]: [phone]  Quentin Ore, MD    Endometrial Ablation Endometrial ablation is a procedure that destroys the thin inner layer of the lining of the uterus (endometrium). This procedure may be done:  To stop heavy menstrual periods.  To stop bleeding that is causing anemia.  To control irregular bleeding.  To treat bleeding caused by small tumors (fibroids) in the endometrium. This procedure is often done as an alternative to major surgery, such as removal of the uterus and cervix (hysterectomy). As a result of this procedure:  You may not be able to have children. However, if you have not yet gone through menopause: ? You may still have a small chance of getting pregnant. ? You will need to use a reliable method of birth control after the procedure to prevent pregnancy.  You may stop having a menstrual period, or you may have only a small amount of bleeding during your period. Menstruation may return several years after the procedure. Tell a health care provider about:  Any allergies you have.  All medicines you  are taking, including vitamins, herbs, eye drops, creams, and over-the-counter medicines.  Any problems you or family members have had with the use of anesthetic medicines.  Any blood disorders you have.  Any surgeries you have had.  Any medical conditions you have.  Whether you are pregnant or may be pregnant. What are the risks? Generally, this is a safe procedure. However, problems may occur, including:  A hole (perforation) in the uterus or bowel.  Infection in the uterus, bladder, or vagina.  Bleeding.  Allergic reaction to medicines.  Damage to nearby structures or organs.  An air bubble in the lung (air embolus).  Problems with pregnancy.  Failure of the procedure.  Decreased ability to diagnose cancer in the endometrium. Scar tissue forms after the procedure, making it more difficult to get a sample of the uterine lining. What happens before the procedure? Medicines Ask your health care provider about:  Changing or stopping your regular medicines. This is especially important if you take diabetes medicines or blood thinners.  Taking medicines such as aspirin and ibuprofen. These medicines can thin your blood. Do not take these medicines before your procedure if your doctor tells you not to take them.  Taking over-the-counter medicines, vitamins, herbs, and supplements. Tests  You will have tests of your endometrium to make sure  there are no precancerous cells or cancer cells present.  You may have an ultrasound of the uterus. General instructions  Do not use any products that contain nicotine or tobacco for at least 4 weeks before the procedure. These include cigarettes, chewing tobacco, and vaping devices, such as e-cigarettes. If you need help quitting, ask your health care provider.  You may be given medicines to thin the endometrium.  Ask your health care provider what steps will be taken to help prevent infection. These steps may include: ? Removing  hair at the surgery site. ? Washing skin with a germ-killing soap. ? Taking antibiotic medicine.  Plan to have a responsible adult take you home from the hospital or clinic.  Plan to have a responsible adult care for you for the time you are told after you leave the hospital or clinic. This is important. What happens during the procedure?  You will lie on an exam table with your feet and legs supported as in a pelvic exam.  An IV will be inserted into one of your veins.  You will be given a medicine to help you relax (sedative).  A surgical tool with a light and camera (resectoscope) will be inserted into your vagina and moved into your uterus. This allows your surgeon to see inside your uterus.  Endometrial tissue will be destroyed and removed, using one of the following methods: ? Radiofrequency. This uses an electrical current to destroy the endometrium. ? Cryotherapy. This uses extreme cold to freeze the endometrium. ? Heated fluid. This uses a heated salt and water (saline) solution to destroy the endometrium. ? Microwave. This uses high-energy microwaves to heat up the endometrium and destroy it. ? Thermal balloon. This involves inserting a catheter with a balloon tip into the uterus. The balloon tip is filled with heated fluid to destroy the endometrium. The procedure may vary among health care providers and hospitals.   What happens after the procedure?  Your blood pressure, heart rate, breathing rate, and blood oxygen level will be monitored until you leave the hospital or clinic.  You may have vaginal bleeding for 4-6 weeks after the procedure. You may also have: ? Cramps. ? A thin, watery vaginal discharge that is light pink or brown. ? A need to urinate more than usual. ? Nausea.  If you were given a sedative during the procedure, it can affect you for several hours. Do not drive or operate machinery until your health care provider says that it is safe.  Do not have  sex or insert anything into your vagina until your health care provider says it is safe. Summary  Endometrial ablation is done to treat many causes of heavy menstrual bleeding. The procedure destroys the thin inner layer of the lining of the uterus (endometrium).  This procedure is often done as an alternative to major surgery, such as removal of the uterus and cervix (hysterectomy).  Plan to have a responsible adult take you home from the hospital or clinic. This information is not intended to replace advice given to you by your health care provider. Make sure you discuss any questions you have with your health care provider. Document Revised: 03/10/2020 Document Reviewed: 03/10/2020 Elsevier Patient Education  2021 [employer].

## 2020-11-22 NOTE — Progress Notes (Signed)
HPI:      [name] is a 35 y.o. [id number] who LMP was Patient's last menstrual period was 11/22/2020., she presents today for her annual examination. The patient has no complaints today. The patient is sexually active. Her last pap: approximate date 2018 and was normal.  She had CIN I by colpo/bx prior to that in 2018.  No PAP since that year.  The patient does perform self breast exams.  There is no notable family history of breast or ovarian cancer in her family.  The patient has regular exercise: yes.  The patient denies current symptoms of depression.    Dysfunctional Uterine Bleeding Patient complains of irregular menses. She had been bleeding regularly. She is now bleeding every 26-30 days and menses are lasting 10-20 days. She changes her pad or tampon every 1 hours. Clots are variable in size. Dysmenorrhea:moderate, occurring throughout cycle. Cyclic symptoms include: none.   She has not tried nor wanted hormone meds for these periods, that have been like this for many years now. Fatigue from blood loss. Takes iron.  Disruptive to lifestyle.  Has discussed ablation in past  GYN History: Contraception: tubal ligation  PMHx: History reviewed. No pertinent past medical history. Past Surgical History:  Procedure Laterality Date  . DENTAL RESTORATION/EXTRACTION WITH X-RAY    . TUBAL LIGATION     Family History  Problem Relation Age of Onset  . Lung cancer Maternal Aunt   . Brain cancer Maternal Aunt   . Breast cancer Paternal Grandmother    Social History   Tobacco Use  . Smoking status: Current Every Day Smoker    Packs/day: 0.50    Types: Cigarettes  . Smokeless tobacco: Never Used  Vaping Use  . Vaping Use: Every day  Substance Use Topics  . Alcohol use: Never    Comment: rarely  . Drug use: Never    Current Outpatient Medications:  .  ferrous sulfate 325 (65 FE) MG tablet, Take 325 mg by mouth daily with breakfast., Disp: , Rfl:  Allergies: Patient has no  known allergies.  Review of Systems  Constitutional: Positive for malaise/fatigue. Negative for chills and fever.  HENT: Negative for congestion, sinus pain and sore throat.   Eyes: Negative for blurred vision and pain.  Respiratory: Negative for cough and wheezing.   Cardiovascular: Negative for chest pain and leg swelling.  Gastrointestinal: Positive for abdominal pain. Negative for constipation, diarrhea, heartburn, nausea and vomiting.  Genitourinary: Negative for dysuria, frequency, hematuria and urgency.  Musculoskeletal: Negative for back pain, joint pain, myalgias and neck pain.  Skin: Negative for itching and rash.  Neurological: Negative for dizziness, tremors and weakness.  Endo/Heme/Allergies: Does not bruise/bleed easily.  Psychiatric/Behavioral: Negative for depression. The patient is not nervous/anxious and does not have insomnia.     Objective: BP 100/70   Ht 5\' 6"  (1.676 m)   Wt 196 lb (88.9 kg)   LMP 11/22/2020   BMI 31.64 kg/m   Filed Weights   11/22/20 0809  Weight: 196 lb (88.9 kg)   Body mass index is 31.64 kg/m. Physical Exam Constitutional:      General: She is not in acute distress.    Appearance: She is well-developed.  Genitourinary:     Rectum normal.     No lesions in the vagina.     No vaginal bleeding.      Right Adnexa: not tender and no mass present.    Left Adnexa: not tender and no  mass present.    No cervical motion tenderness, friability, lesion or polyp.     Cervical exam comments: Large cervix, no lesions or polyps Exam amenable to future ablation procedure (if desired).     Uterus is not enlarged.     No uterine mass detected.    Uterus is anteverted.     Pelvic exam was performed with patient in the lithotomy position.  Breasts:     Right: No mass, skin change or tenderness.     Left: No mass, skin change or tenderness.    HENT:     Head: Normocephalic and atraumatic. No laceration.     Right Ear: Hearing normal.     Left  Ear: Hearing normal.     Mouth/Throat:     Pharynx: Uvula midline.  Eyes:     Pupils: Pupils are equal, round, and reactive to light.  Neck:     Thyroid: No thyromegaly.  Cardiovascular:     Rate and Rhythm: Normal rate and regular rhythm.     Heart sounds: No murmur heard. No friction rub. No gallop.   Pulmonary:     Effort: Pulmonary effort is normal. No respiratory distress.     Breath sounds: Normal breath sounds. No wheezing.  Abdominal:     General: Bowel sounds are normal. There is no distension.     Palpations: Abdomen is soft.     Tenderness: There is no abdominal tenderness. There is no rebound.  Musculoskeletal:        General: Normal range of motion.     Cervical back: Normal range of motion and neck supple.  Neurological:     Mental Status: She is alert and oriented to person, place, and time.     Cranial Nerves: No cranial nerve deficit.  Skin:    General: Skin is warm and dry.  Psychiatric:        Judgment: Judgment normal.  Vitals reviewed.     Assessment:  ANNUAL EXAM 1. Women's annual routine gynecological examination   2. Screening for cervical cancer   3. CIN I (cervical intraepithelial neoplasia I)   4. Menorrhagia with regular cycle   5. Screening for cholesterol level   6. Screening for thyroid disorder   7. Screening for diabetes mellitus      Screening Plan:            1.  Cervical Screening-  Pap smear done today  2. Breast screening- Exam annually and mammogram>40 planned   3.  Labs Ordered today  4. Counseling for contraception: s/p BTL   5. Menorrhagia with regular cycle - Patient has abnormal uterine bleeding . She has a normal exam today, with no evidence of lesions.  Evaluation includes the following: exam, labs such as hormonal testing, and pelvic ultrasound to evaluate for any structural gynecologic abnormalities.  Patient to follow up after testing.  Treatment option for menorrhagia or menometrorrhagia discussed in great  detail with the patient.  Options include hormonal therapy, IUD therapy such as Mirena, D&C, Ablation, and Hysterectomy.  The pros and cons of each option discussed with patient.  Pt considering and is a candidate for IN OFFICE ABLATION.  - Surgical pathology from [facility] today pending, see below - US PELVIC COMPLETE WITH TRANSVAGINAL; Future to assess anatomy   Endometrial Biopsy After discussion with the patient regarding her abnormal uterine bleeding I recommended that she proceed with an endometrial biopsy for further diagnosis. The risks, benefits, alternatives, and indications for an endometrial  biopsy were discussed with the patient in detail. She understood the risks including infection, bleeding, cervical laceration and uterine perforation.  Verbal consent was obtained.   PROCEDURE NOTE:  Pipelle endometrial biopsy was performed using aseptic technique with iodine preparation.  The uterus was sounded to a length of 8 cm.  Adequate sampling was obtained with minimal blood loss.  The patient tolerated the procedure well.  Disposition will be pending pathology.    F/U  Return in about 1 year (around 11/22/2021) for Annual.  Annamarie Major, MD, [facility], [facility] 11/22/2020  8:37 AM

## 2020-11-23 LAB — CYTOLOGY - PAP
Comment: NEGATIVE
Diagnosis: NEGATIVE
High risk HPV: NEGATIVE

## 2020-11-23 LAB — LIPID PANEL
Chol/HDL Ratio: 2.2 ratio (ref 0.0–4.4)
Cholesterol, Total: 124 mg/dL (ref 100–199)
HDL: 57 mg/dL (ref >39)
LDL Chol Calc (NIH): 56 mg/dL (ref 0–99)
Triglycerides: 43 mg/dL (ref 0–149)
VLDL Cholesterol Cal: 11 mg/dL (ref 5–40)

## 2020-11-23 LAB — GLUCOSE, FASTING: Glucose, Plasma: 89 mg/dL (ref 65–99)

## 2020-11-23 LAB — TSH: TSH: 2.03 u[IU]/mL (ref 0.450–4.500)

## 2020-11-23 LAB — SURGICAL PATHOLOGY

## 2020-11-28 DIAGNOSIS — N83291 Other ovarian cyst, right side: Secondary | ICD-10-CM | POA: Diagnosis not present

## 2020-11-28 DIAGNOSIS — N920 Excessive and frequent menstruation with regular cycle: Secondary | ICD-10-CM | POA: Diagnosis not present

## 2020-11-28 DIAGNOSIS — N83201 Unspecified ovarian cyst, right side: Secondary | ICD-10-CM | POA: Diagnosis not present

## 2020-11-28 DIAGNOSIS — N83292 Other ovarian cyst, left side: Secondary | ICD-10-CM | POA: Diagnosis not present

## 2020-11-28 DIAGNOSIS — N83202 Unspecified ovarian cyst, left side: Secondary | ICD-10-CM | POA: Diagnosis not present

## 2020-12-01 ENCOUNTER — Telehealth: Payer: Self-pay

## 2020-12-01 ENCOUNTER — Other Ambulatory Visit: Payer: Self-pay | Admitting: Obstetrics & Gynecology

## 2020-12-01 DIAGNOSIS — N920 Excessive and frequent menstruation with regular cycle: Secondary | ICD-10-CM

## 2020-12-01 NOTE — Progress Notes (Signed)
Discussed by phone [place] results, done for menorrhagia. Also, EMB results discussed.  Ultrasound demonstrates no masses seen    GYN [place] at [facility] 11/28/20  Assessment:  Menorrhagia with regular cycle  Options discussed, will plan in office ablation  Annamarie Major, MD, [facility], [facility] 12/01/2020  10:35 AM

## 2020-12-01 NOTE — Telephone Encounter (Signed)
-----   Message from Nadara Mustard, MD sent at 12/01/2020 10:37 AM EDT ----- Regarding: In Office Procedure Surgery Booking Request Patient Full Name:  Lynn Zuniga  MRN: [id number]  DOB: [birth year]  Surgeon: Letitia Libra, MD  Requested Surgery Date and Time: 12/30/2020 please (pt prefers a Friday)    Also needs a preop that week for consents and meds, plz schedule Primary Diagnosis AND Code: Menorrhagia N92.0 Secondary Diagnosis and Code:  Surgical Procedure: Hysteroscopy D&C with Ablation Admission Status: IN OFFICE Length of Surgery: 25 min Special Case Needs: Yes - MINERVA H&P: Yes Phone Interview???:  No

## 2020-12-01 NOTE — Telephone Encounter (Signed)
Called patient to schedule in office Hysteroscopy D&C using [facility]erva w Norm Salt has been been contacted. He said he may have a conflict but knows that Tiburcio Pea knows how to use the controller. He said to go ahead and schedule  DOS 4/29 at 10:40 am  H&P 4/26 @ 4:30 - consults and meds   Pt asked about her responsibility. I adv that I will look at her insurance and follow back up with her.

## 2020-12-02 NOTE — Telephone Encounter (Signed)
Called pt with cost info I was able to gather. I gave her the following:  Cpt 58563 charge = $5850.00 Allowed amt (per [employer]) = 50% of charge Pt deductible = $2000 ($0 applied) $6000 max OOP  I adv that she will be responsible for her deductible and the remaining $925 will be determined based on her plan contract.  Spoke to Shay-ron/[employer] - call ref# [id number]

## 2020-12-05 ENCOUNTER — Ambulatory Visit: Payer: BC Managed Care – PPO | Admitting: Obstetrics & Gynecology

## 2020-12-05 NOTE — Telephone Encounter (Signed)
Noted. CMA aware. 

## 2020-12-27 ENCOUNTER — Ambulatory Visit (INDEPENDENT_AMBULATORY_CARE_PROVIDER_SITE_OTHER): Payer: BC Managed Care – PPO | Admitting: Obstetrics & Gynecology

## 2020-12-27 ENCOUNTER — Encounter: Payer: Self-pay | Admitting: Obstetrics & Gynecology

## 2020-12-27 ENCOUNTER — Other Ambulatory Visit: Payer: Self-pay

## 2020-12-27 VITALS — BP 100/60 | Ht 66.0 in | Wt 201.0 lb

## 2020-12-27 DIAGNOSIS — N920 Excessive and frequent menstruation with regular cycle: Secondary | ICD-10-CM | POA: Diagnosis not present

## 2020-12-27 NOTE — Progress Notes (Signed)
      HPI:  Lynn Zuniga is a 35 y.o. [id number] Patient's last menstrual period was 12/15/2020.; she isseen for concerns related to abnormal uterine bleeding.  She has heavy and prolonged periods, planning ablation as her treatment option after discussing all options.  No desire for fertility.  PMHx: History reviewed. No pertinent past medical history. Past Surgical History:  Procedure Laterality Date  . DENTAL RESTORATION/EXTRACTION WITH X-RAY    . TUBAL LIGATION     Family History  Problem Relation Age of Onset  . Lung cancer Maternal Aunt   . Brain cancer Maternal Aunt   . Breast cancer Paternal Grandmother    Social History   Tobacco Use  . Smoking status: Current Every Day Smoker    Packs/day: 0.50    Types: Cigarettes  . Smokeless tobacco: Never Used  Vaping Use  . Vaping Use: Every day  Substance Use Topics  . Alcohol use: Never    Comment: rarely  . Drug use: Never    Current Outpatient Medications:  .  ferrous sulfate 325 (65 FE) MG tablet, Take 325 mg by mouth daily with breakfast., Disp: , Rfl:  Allergies: Patient has no known allergies.  Review of Systems  Constitutional: Negative for chills, fever and malaise/fatigue.  HENT: Negative for congestion, sinus pain and sore throat.   Eyes: Negative for blurred vision and pain.  Respiratory: Negative for cough and wheezing.   Cardiovascular: Negative for chest pain and leg swelling.  Gastrointestinal: Negative for abdominal pain, constipation, diarrhea, heartburn, nausea and vomiting.  Genitourinary: Negative for dysuria, frequency, hematuria and urgency.  Musculoskeletal: Negative for back pain, joint pain, myalgias and neck pain.  Skin: Negative for itching and rash.  Neurological: Negative for dizziness, tremors and weakness.  Endo/Heme/Allergies: Does not bruise/bleed easily.  Psychiatric/Behavioral: Negative for depression. The patient is not nervous/anxious and does not have insomnia.      Objective: BP 100/60   Ht 5\' 6"  (1.676 m)   Wt 201 lb (91.2 kg)   LMP 12/15/2020   BMI 32.44 kg/m   Filed Weights   12/27/20 1614  Weight: 201 lb (91.2 kg)   Physical Exam Constitutional:      General: She is not in acute distress.    Appearance: She is well-developed.  Musculoskeletal:        General: Normal range of motion.  Neurological:     Mental Status: She is alert and oriented to person, place, and time.  Skin:    General: Skin is warm and dry.  Vitals reviewed.     Assessment: 1. Menorrhagia with regular cycle   Patient was told that it is normal to have menstrual bleeding after an endometrial ablation, only about 80% of patients become amenorrheic, 10% of patients have normal or light periods, and 10% of patients have no change in their bleeding pattern and may need further intervention.  She was told she will observe her periods for a few months after her ablation to see what her periods will be like; it is recommended to wait until at least three months after the procedure before making conclusions about how periods are going to be like after an ablation.  Medications discussed for before and after the procedure. Rx given.  12/29/20, MD, Annamarie Major Ob/Gyn, [facility] 12/27/2020  4:38 PM

## 2020-12-28 ENCOUNTER — Other Ambulatory Visit: Payer: Self-pay | Admitting: Obstetrics & Gynecology

## 2020-12-28 ENCOUNTER — Telehealth: Payer: Self-pay

## 2020-12-28 MED ORDER — DIAZEPAM 5 MG PO TABS: 5.0000 mg | ORAL_TABLET | Freq: Once | ORAL | 0 refills | Status: AC

## 2020-12-28 MED ORDER — IBUPROFEN 600 MG PO TABS: 600.0000 mg | ORAL_TABLET | Freq: Four times a day (QID) | ORAL | 3 refills | Status: DC | PRN

## 2020-12-28 MED ORDER — HYDROCODONE-ACETAMINOPHEN 5-325 MG PO TABS: 1.0000 | ORAL_TABLET | Freq: Four times a day (QID) | ORAL | 0 refills | Status: DC | PRN

## 2020-12-28 NOTE — Telephone Encounter (Signed)
[name] from [employer] in [place] calling; states pt came in with a piece of printed paper with the following rxs on it:  Hydrocodone-acetominophen 5-325, valium 5mg , phenergan 25mg  tabs, ibuprophen 600mg , and cytotec 200mg ; states the paper was signed R.P. Harris.  Adv I would let PH know.  [phone]

## 2020-12-28 NOTE — Telephone Encounter (Signed)
done

## 2020-12-28 NOTE — Telephone Encounter (Signed)
The [facility] in [place] is requesting all pain medication be sent thru eRx please.

## 2020-12-28 NOTE — Telephone Encounter (Signed)
It is her preop medicine list I gave her yesterday

## 2020-12-30 ENCOUNTER — Ambulatory Visit (INDEPENDENT_AMBULATORY_CARE_PROVIDER_SITE_OTHER): Payer: BC Managed Care – PPO | Admitting: Obstetrics & Gynecology

## 2020-12-30 ENCOUNTER — Other Ambulatory Visit: Payer: Self-pay

## 2020-12-30 ENCOUNTER — Encounter: Payer: Self-pay | Admitting: Obstetrics & Gynecology

## 2020-12-30 VITALS — BP 120/80 | Ht 66.0 in | Wt 199.0 lb

## 2020-12-30 DIAGNOSIS — N920 Excessive and frequent menstruation with regular cycle: Secondary | ICD-10-CM | POA: Diagnosis not present

## 2020-12-30 NOTE — Progress Notes (Signed)
Hysteroscopy with Minerva Endometrial Ablation  Operative Report   Place of Surgery: [facility] Pre-Operative Diagnosis: Menorrhagia   Procedure: Hysteroscopy with Minerva Endometrial Ablation  Surgeon: Annamarie Major, MD  Anesthesia: Paracervical Block   Operative Technique:  The patient was prepped and draped in the usual manner for Hysteroscopy with Minerva. Patient had voided prior to coming to the procedure room. A short weighted speculum was placed into the vagina and the anterior lip of the cervix was grasped with a single tooth tenaculum. Deep intra-cervical block was placed with a mixture of Marcaine 0.5% (6 cc's) and Lidocaine 1% (6 cc's) by placing 5 cc's into each quadrant with a spinal needle. The endocervical canal was measured to 4 centimeters. The uterus was then sounded to 9 centimeters. The cervix was dilated to 8 mm with Hegar dilators. Hysteroscopy was then carried out using saline as the distention media.  Hysteroscopy revealed:   A normal endometrial cavity without perforation or myometrial damage. Both tubal ostia were visualized.   It was decided to proceed with the Minerva ablation. The device was set for the appropriate cavity length and was placed into the uterus and deployed. The width was noted on the device to be in the approved Green Zone. The cervical balloon was inflated and the cervical seal was confirmed by the Minerva Multiplex Controller. The foot pedal was then pressed and the 2-part Uterine Integrity Test (UIT) confirmed the integrity of the cavity. Upon completion of the UIT, the treatment cycle lasted 120 seconds. The device was unlocked and removed.   Repeat hysteroscopy revealed that all visual areas of active endometrium had been adequately treated. At this point the procedure is complete and the patient was in stable condition.   She will be allowed to return to her home, provided that she is doing well. She will be instructed to call to report any increased  bleeding, uncontrolled pain, fever, SOB, or any other significant change in her condition. She is given a postoperative instruction sheet and a follow up appointment for 2 weeks.   Annamarie Major, MD, [facility] Ob/G[facility], [facility] 12/30/2020  11:46 AM

## 2021-01-16 ENCOUNTER — Encounter: Payer: Self-pay | Admitting: Obstetrics & Gynecology

## 2021-01-16 ENCOUNTER — Ambulatory Visit (INDEPENDENT_AMBULATORY_CARE_PROVIDER_SITE_OTHER): Payer: BC Managed Care – PPO | Admitting: Obstetrics & Gynecology

## 2021-01-16 ENCOUNTER — Other Ambulatory Visit: Payer: Self-pay

## 2021-01-16 VITALS — Ht 66.0 in | Wt 204.0 lb

## 2021-01-16 DIAGNOSIS — N920 Excessive and frequent menstruation with regular cycle: Secondary | ICD-10-CM | POA: Diagnosis not present

## 2021-01-16 DIAGNOSIS — Z9889 Other specified postprocedural states: Secondary | ICD-10-CM | POA: Insufficient documentation

## 2021-01-16 NOTE — Progress Notes (Signed)
Virtual Visit via Telephone Note  I connected with Lynn Zuniga on 01/16/21 at 11:00 AM EDT by telephone and verified that I am speaking with the correct person using two identifiers.  Location: Patient: Home Provider: Office   I discussed the limitations, risks, security and privacy concerns of performing an evaluation and management service by telephone and the availability of in person appointments. I also discussed with the patient that there may be a patient responsible charge related to this service. The patient expressed understanding and agreed to proceed.  History of Present Illness: Postoperative Follow-up Patient presents post op from Endometrial Ablation for abnormal uterine bleeding, 2 weeks ago.  Subjective: Patient reports marked improvement in her preop symptoms. Eating a regular diet without difficulty. The patient is not having any pain.  Activity: normal activities of daily living. Patient reports additional symptom's since surgery of None.   Observations/Objective: No exam today, due to telephone eVisit due to [facility] virus restriction on elective visits and procedures.  Prior visits reviewed along with ultrasounds/labs as indicated.  Assessment: s/p :  endometrial ablation progressing well  Plan: Patient has done well after surgery with no apparent complications.  I have discussed the post-operative course to date, and the expected progress moving forward.  The patient understands what complications to be concerned about.  I will see the patient in routine follow up, or sooner if needed.    Activity plan: No restriction. Monitor next couple of cycles PAP due at annual  Follow Up Instructions:  I discussed the assessment and treatment plan with the patient. The patient was provided an opportunity to ask questions and all were answered. The patient agreed with the plan and demonstrated an understanding of the instructions.   The patient was advised to call back  or seek an in-person evaluation if the symptoms worsen or if the condition fails to improve as anticipated.  I provided 15 minutes of non-face-to-face time during this encounter.   Letitia Libra, MD

## 2021-11-02 ENCOUNTER — Ambulatory Visit: Payer: Managed Care, Other (non HMO) | Admitting: Obstetrics & Gynecology

## 2021-12-12 ENCOUNTER — Ambulatory Visit (INDEPENDENT_AMBULATORY_CARE_PROVIDER_SITE_OTHER): Payer: BC Managed Care – PPO | Admitting: Obstetrics

## 2021-12-12 ENCOUNTER — Other Ambulatory Visit (HOSPITAL_COMMUNITY)
Admission: RE | Admit: 2021-12-12 | Discharge: 2021-12-12 | Disposition: A | Discharge status: Home / Self Care | Payer: BC Managed Care – PPO | Source: Ambulatory Visit | Attending: Obstetrics | Admitting: Obstetrics

## 2021-12-12 ENCOUNTER — Encounter: Payer: Self-pay | Admitting: Obstetrics

## 2021-12-12 VITALS — BP 122/70 | Ht 65.0 in | Wt 206.0 lb

## 2021-12-12 DIAGNOSIS — Z113 Encounter for screening for infections with a predominantly sexual mode of transmission: Secondary | ICD-10-CM | POA: Diagnosis not present

## 2021-12-12 DIAGNOSIS — Z124 Encounter for screening for malignant neoplasm of cervix: Secondary | ICD-10-CM

## 2021-12-12 DIAGNOSIS — Z01419 Encounter for gynecological examination (general) (routine) without abnormal findings: Secondary | ICD-10-CM | POA: Diagnosis not present

## 2021-12-12 NOTE — Progress Notes (Signed)
? ? ? ?Gynecology Annual Exam   ?PCP: Patient, No Pcp Per (Inactive) ? ?Chief Complaint:  ?Chief Complaint  ?Patient presents with  ? Annual Exam  ? ? ?History of Present Illness: Patient is a 36 y.o. [id number] presents for annual exam. The patient has no complaints today. She does request some STI testing. One sexual partner. Sam works at [employer] and has two children, ages 53 and 63. She had a BTL  and later , an ablation, so has no periods and no c/o vaginal bleeding. ? ?LMP: No LMP recorded. Patient has had an ablation. ?AThe patient is sexually active. She currently uses tubal ligation for contraception. She denies dyspareunia.  The patient does not perform self breast exams.  There is no notable family history of breast or ovarian cancer in her family. ? ?The patient wears seatbelts: yes.   The patient has regular exercise: no.   ? ?The patient denies current symptoms of depression.   ? ?Review of Systems: Review of Systems  ?Constitutional:  Positive for malaise/fatigue.  ?     She tends to feel tired a lot.  ?HENT: Negative.    ?Eyes: Negative.   ?Respiratory: Negative.    ?Cardiovascular: Negative.   ?Gastrointestinal: Negative.   ?Genitourinary: Negative.   ?Musculoskeletal: Negative.   ?Skin: Negative.   ?Neurological: Negative.   ?Endo/Heme/Allergies: Negative.   ?Psychiatric/Behavioral:    ?     PHQ score is 7 ?GAD is 4  ? ?Past Medical History:  ?Patient Active Problem List  ? Diagnosis Date Noted  ? S/P endometrial ablation 01/16/2021  ? CIN I (cervical intraepithelial neoplasia I) 11/22/2020  ?  2018 Colpo Bx diagnosis ?  ? Cervical dysplasia 11/19/2016  ?  2018 Colpo Bx Diagnosis ?  ? ? ?Past Surgical History:  ?Past Surgical History:  ?Procedure Laterality Date  ? DENTAL RESTORATION/EXTRACTION WITH X-RAY    ? ENDOMETRIAL ABLATION    ? TUBAL LIGATION    ? ? ?Gynecologic History:  ?No LMP recorded. Patient has had an ablation. ?Contraception: tubal ligation ?Last Pap: Results were: no abnormalities   ? ?Obstetric History: [id number] ? ?Family History:  ?Family History  ?Problem Relation Age of Onset  ? Lung cancer Maternal Aunt   ? Brain cancer Maternal Aunt   ? Breast cancer Paternal Grandmother   ? ? ?Social History:  ?Social History  ? ?Socioeconomic History  ? Marital status: Single  ?  Spouse name: Not on file  ? Number of children: Not on file  ? Years of education: Not on file  ? Highest education level: Not on file  ?Occupational History  ? Not on file  ?Tobacco Use  ? Smoking status: Every Day  ?  Packs/day: 0.50  ?  Types: Cigarettes  ? Smokeless tobacco: Never  ?Vaping Use  ? Vaping Use: Every day  ?Substance and Sexual Activity  ? Alcohol use: Never  ?  Comment: rarely  ? Drug use: Never  ? Sexual activity: Yes  ?  Birth control/protection: Surgical  ?Other Topics Concern  ? Not on file  ?Social History Narrative  ? Not on file  ? ?Social Determinants of Health  ? ?Financial Resource Strain: Not on file  ?Food Insecurity: Not on file  ?Transportation Needs: Not on file  ?Physical Activity: Not on file  ?Stress: Not on file  ?Social Connections: Not on file  ?Intimate Partner Violence: Not on file  ? ? ?Allergies:  ?No Known Allergies ? ?Medications: ?Prior to Admission  medications   ?Medication Sig Start Date End Date Taking? Authorizing Provider  ?ferrous sulfate 325 (65 FE) MG tablet Take 325 mg by mouth daily with breakfast. ?Patient not taking: Reported on 12/12/2021    [provider]  ? ? ?Physical Exam ?Vitals: Blood pressure 122/70, height 5\' 5"  (1.651 m), weight 206 lb (93.4 kg). ? ?General: NAD, high BMI of 34. ?HEENT: normocephalic, anicteric ?Thyroid: no enlargement, no palpable nodules ?Pulmonary: No increased work of breathing, CTAB ?Cardiovascular: RRR, distal pulses 2+ ?Breast: Breast symmetrical,  B cup size, , pierced nipples.no tenderness, no palpable nodules or masses, no skin or nipple retraction present, no nipple discharge.  No axillary or supraclavicular  lymphadenopathy. ?Abdomen: NABS, soft, non-tender, non-distended.  Umbilicus without lesions.  No hepatomegaly, splenomegaly or masses palpable. No evidence of hernia  ?Genitourinary: ? External: Normal external female genitalia.  Normal urethral meatus, normal Bartholin's and Skene's glands. She has a piercing  ? Vagina: Normal vaginal mucosa, no evidence of prolapse.   ? Cervix: Grossly normal in appearance, no bleeding ? Uterus: Non-enlarged, mobile, normal contour.  No CMT ? Adnexa: ovaries non-enlarged, no adnexal masses ? Rectal: deferred ? Lymphatic: no evidence of inguinal lymphadenopathy ?Extremities: no edema, erythema, or tenderness ?Neurologic: Grossly intact ?Psychiatric: mood appropriate, affect full ? ?Female chaperone present for pelvic and breast  portions of the physical exam ? ? ? ?Assessment: 36 y.o. 31 routine annual exam ?STI screening requested. ? ?Plan: ?Problem List Items Addressed This Visit   ?None ?Visit Diagnoses   ? ? Screen for STD (sexually transmitted disease)    -  Primary  ? Relevant Orders  ? HEP, RPR, HIV Panel  ? Cervicovaginal ancillary only  ? Women's annual routine gynecological examination      ? Relevant Orders  ? HEP, RPR, HIV Panel  ? Cervicovaginal ancillary only  ? Screening for cervical cancer      ? Relevant Orders  ? Cytology - PAP  ? ?  ? ? ?2) STI screening  wasoffered and accepted ? ?2)  ASCCP guidelines and rational discussed.  Patient opts for every 3 years screening interval. She has a hx of abnormal papas with CIN, but her last two have been NILM. Will do HPV testing with this pap. ? ?3) Contraception - the patient is currently using  tubal ligation.  She is happy with her current form of contraception and plans to continue ? ?4) Routine healthcare maintenance including cholesterol, diabetes screening discussed  should be managed by a PCP. She is in the process of establishing care with a PCP. I have advised her to have her thyroid checked and discuss her  chronic fatigue with her Primary. ? ?5) Return in about 1 year (around 12/13/2022) for annual. ? ? ?02/12/2023, CNM  ?12/12/2021 4:59 PM  ? ?[facility], [facility] ?12/12/2021, 4:58 PM ? ? ?  ?

## 2021-12-13 LAB — HEP, RPR, HIV PANEL
HIV Screen 4th Generation wRfx: NONREACTIVE
Hepatitis B Surface Ag: NEGATIVE
RPR Ser Ql: NONREACTIVE

## 2021-12-14 LAB — CERVICOVAGINAL ANCILLARY ONLY
Bacterial Vaginitis (gardnerella): POSITIVE — AB
Chlamydia: NEGATIVE
Comment: NEGATIVE
Comment: NEGATIVE
Comment: NEGATIVE
Comment: NORMAL
Neisseria Gonorrhea: NEGATIVE
Trichomonas: NEGATIVE

## 2021-12-14 LAB — CYTOLOGY - PAP
Comment: NEGATIVE
Diagnosis: NEGATIVE
High risk HPV: NEGATIVE

## 2021-12-15 ENCOUNTER — Encounter: Payer: Self-pay | Admitting: Obstetrics

## 2021-12-15 ENCOUNTER — Other Ambulatory Visit: Payer: Self-pay | Admitting: Obstetrics

## 2021-12-15 DIAGNOSIS — B9689 Other specified bacterial agents as the cause of diseases classified elsewhere: Secondary | ICD-10-CM

## 2021-12-15 MED ORDER — METRONIDAZOLE 500 MG PO TABS: 500.0000 mg | ORAL_TABLET | Freq: Two times a day (BID) | ORAL | 0 refills | Status: AC

## 2021-12-15 NOTE — Progress Notes (Signed)
Metronidazole

## 2022-01-11 ENCOUNTER — Encounter: Payer: Self-pay | Admitting: Family Medicine

## 2022-01-11 ENCOUNTER — Ambulatory Visit (INDEPENDENT_AMBULATORY_CARE_PROVIDER_SITE_OTHER): Payer: BC Managed Care – PPO | Admitting: Family Medicine

## 2022-01-11 VITALS — BP 110/62 | HR 77 | Ht 65.0 in | Wt 206.2 lb

## 2022-01-11 DIAGNOSIS — F32A Depression, unspecified: Secondary | ICD-10-CM

## 2022-01-11 DIAGNOSIS — D649 Anemia, unspecified: Secondary | ICD-10-CM | POA: Diagnosis not present

## 2022-01-11 DIAGNOSIS — R5382 Chronic fatigue, unspecified: Secondary | ICD-10-CM | POA: Diagnosis not present

## 2022-01-11 DIAGNOSIS — F419 Anxiety disorder, unspecified: Secondary | ICD-10-CM | POA: Diagnosis not present

## 2022-01-11 MED ORDER — SERTRALINE HCL 50 MG PO TABS: 50.0000 mg | ORAL_TABLET | Freq: Every day | ORAL | 0 refills | Status: DC

## 2022-01-11 NOTE — Assessment & Plan Note (Signed)
Elevated PHQ and GAD scores, longstanding issue with no prior treatments.  Plan for initiation of Zoloft 50 mg and follow-up for review at annual physical. ?

## 2022-01-11 NOTE — Patient Instructions (Signed)
-   Obtain blood work today ?- Start sertraline daily ?- You will receive information regarding home sleep study ?- Return for follow-up in 1 month for annual physical ?

## 2022-01-11 NOTE — Assessment & Plan Note (Addendum)
Patient presents for establishment care, advised to do so by her OB/GYN group for concern over chronic fatigue.  Patient states that she has had greater than 1 year history of fatigue described as daytime somnolence, low energy.  Of note, she has a prior history of iron deficiency anemia, was advised patient, has not been on this regimen or had recent serum studies in that regard.  Additionally, she brings up lack of restful sleep, intrusive thoughts preventing her from sleep initiation, and snoring. ? ?Patient examination today reveals no thyromegaly, positive S1-S2, regular rate and rhythm, no additional heart sounds, clear lung fields throughout without wheezes, rales, rhonchi, abdomen soft, nontender, nondistended with normoactive bowel sounds. ? ?Constellation of symptoms given her medical history can be secondary to persistent iron deficiency anemia, thyroid abnormalities, undiagnosed/untreated obstructive sleep apnea, sequela of depression/anxiety.  I reviewed the same with the patient as well as evaluation management steps.  Plan for serum studies, home sleep study, initiation of Zoloft, and close follow-up for reevaluation. ?

## 2022-01-11 NOTE — Assessment & Plan Note (Signed)
Chronic condition, has not been on iron supplementation, plan for serum studies and will follow results once available. ?

## 2022-01-11 NOTE — Progress Notes (Signed)
?  ? ?  Primary Care / Sports Medicine Office Visit ? ?Patient Information:  ?Patient ID: Lynn Zuniga, female DOB: [birth year] Age: 36 y.o. MRN: [id number]  ? ?Lynn Zuniga is a pleasant 36 y.o. female presenting with the following: ? ?Chief Complaint  ?Patient presents with  ? Establish Care  ? Anxiety  ? Fatigue  ?  Wants Tsh checked  ? ? ?Vitals:  ? 01/11/22 1415  ?BP: 110/62  ?Pulse: 77  ?SpO2: 98%  ? ?Vitals:  ? 01/11/22 1415  ?Weight: 206 lb 3.2 oz (93.5 kg)  ?Height: 5\' 5"  (1.651 m)  ? ?Body mass index is 34.31 kg/m?. ? ?No results found.  ? ?Independent interpretation of notes and tests performed by another provider:  ? ?None ? ?Procedures performed:  ? ?None ? ?Pertinent History, Exam, Impression, and Recommendations:  ? ?Problem List Items Addressed This Visit   ? ?  ? Other  ? Chronic fatigue - Primary  ?  Patient presents for establishment care, advised to do so by her OB/GYN group for concern over chronic fatigue.  Patient states that she has had greater than 1 year history of fatigue described as daytime somnolence, low energy.  Of note, she has a prior history of iron deficiency anemia, was advised patient, has not been on this regimen or had recent serum studies in that regard.  Additionally, she brings up lack of restful sleep, intrusive thoughts preventing her from sleep initiation, and snoring. ? ?Patient examination today reveals no thyromegaly, positive S1-S2, regular rate and rhythm, no additional heart sounds, clear lung fields throughout without wheezes, rales, rhonchi, abdomen soft, nontender, nondistended with normoactive bowel sounds. ? ?Constellation of symptoms given her medical history can be secondary to persistent iron deficiency anemia, thyroid abnormalities, undiagnosed/untreated obstructive sleep apnea, sequela of depression/anxiety.  I reviewed the same with the patient as well as evaluation management steps.  Plan for serum studies, home sleep study, initiation of Zoloft, and  close follow-up for reevaluation. ? ?  ?  ? Relevant Orders  ? TSH  ? CBC with Differential/Platelet  ? Comprehensive metabolic panel  ? Iron, TIBC and Ferritin Panel  ? B12 and Folate Panel  ? Anxiety and depression  ?  Elevated PHQ and GAD scores, longstanding issue with no prior treatments.  Plan for initiation of Zoloft 50 mg and follow-up for review at annual physical. ? ?  ?  ? Relevant Medications  ? sertraline (ZOLOFT) 50 MG tablet  ? Other Relevant Orders  ? TSH  ? Anemia  ?  Chronic condition, has not been on iron supplementation, plan for serum studies and will follow results once available. ? ?  ?  ? Relevant Orders  ? CBC with Differential/Platelet  ? Iron, TIBC and Ferritin Panel  ? B12 and Folate Panel  ?  ? ?Orders & Medications ?Meds ordered this encounter  ?Medications  ? sertraline (ZOLOFT) 50 MG tablet  ?  Sig: Take 1 tablet (50 mg total) by mouth daily.  ?  Dispense:  30 tablet  ?  Refill:  0  ? ?Orders Placed This Encounter  ?Procedures  ? TSH  ? CBC with Differential/Platelet  ? Comprehensive metabolic panel  ? Iron, TIBC and Ferritin Panel  ? B12 and Folate Panel  ?  ? ?Return in about 4 weeks (around 02/08/2022).  ?  ? ?04/10/2022, MD ? ? Primary Care Sports Medicine ?[place] [facility] ?[facility] [place]  ? ?

## 2022-01-12 LAB — TSH: TSH: 1.62 u[IU]/mL (ref 0.450–4.500)

## 2022-01-12 LAB — COMPREHENSIVE METABOLIC PANEL
ALT: 17 IU/L (ref 0–32)
AST: 25 IU/L (ref 0–40)
Albumin/Globulin Ratio: 1.6 (ref 1.2–2.2)
Albumin: 4.3 g/dL (ref 3.8–4.8)
Alkaline Phosphatase: 93 IU/L (ref 44–121)
BUN/Creatinine Ratio: 20 (ref 9–23)
BUN: 15 mg/dL (ref 6–20)
Bilirubin Total: 0.3 mg/dL (ref 0.0–1.2)
CO2: 24 mmol/L (ref 20–29)
Calcium: 9.5 mg/dL (ref 8.7–10.2)
Chloride: 99 mmol/L (ref 96–106)
Creatinine, Ser: 0.75 mg/dL (ref 0.57–1.00)
Globulin, Total: 2.7 g/dL (ref 1.5–4.5)
Glucose: 84 mg/dL (ref 70–99)
Potassium: 4.6 mmol/L (ref 3.5–5.2)
Sodium: 139 mmol/L (ref 134–144)
Total Protein: 7 g/dL (ref 6.0–8.5)
eGFR: 106 mL/min/{1.73_m2} (ref >59)

## 2022-01-12 LAB — B12 AND FOLATE PANEL
Folate: 8.1 ng/mL (ref >3.0)
Vitamin B-12: 498 pg/mL (ref 232–1245)

## 2022-01-12 LAB — IRON,TIBC AND FERRITIN PANEL
Ferritin: 20 ng/mL (ref 15–150)
Iron Saturation: 16 % (ref 15–55)
Iron: 59 ug/dL (ref 27–159)
Total Iron Binding Capacity: 367 ug/dL (ref 250–450)
UIBC: 308 ug/dL (ref 131–425)

## 2022-01-12 LAB — CBC WITH DIFFERENTIAL/PLATELET
Basophils Absolute: 0.1 10*3/uL (ref 0.0–0.2)
Basos: 1 %
EOS (ABSOLUTE): 0.2 10*3/uL (ref 0.0–0.4)
Eos: 3 %
Hematocrit: 37.7 % (ref 34.0–46.6)
Hemoglobin: 12.2 g/dL (ref 11.1–15.9)
Immature Grans (Abs): 0 10*3/uL (ref 0.0–0.1)
Immature Granulocytes: 0 %
Lymphocytes Absolute: 3.1 10*3/uL (ref 0.7–3.1)
Lymphs: 31 %
MCH: 28.7 pg (ref 26.6–33.0)
MCHC: 32.4 g/dL (ref 31.5–35.7)
MCV: 89 fL (ref 79–97)
Monocytes Absolute: 0.5 10*3/uL (ref 0.1–0.9)
Monocytes: 6 %
Neutrophils Absolute: 5.8 10*3/uL (ref 1.4–7.0)
Neutrophils: 59 %
Platelets: 281 10*3/uL (ref 150–450)
RBC: 4.25 x10E6/uL (ref 3.77–5.28)
RDW: 12.8 % (ref 11.7–15.4)
WBC: 9.8 10*3/uL (ref 3.4–10.8)

## 2022-01-16 ENCOUNTER — Ambulatory Visit: Payer: Self-pay | Admitting: Family Medicine

## 2022-01-22 ENCOUNTER — Ambulatory Visit: Payer: Self-pay | Admitting: Family Medicine

## 2022-02-08 ENCOUNTER — Encounter: Payer: Self-pay | Admitting: Family Medicine

## 2022-02-08 ENCOUNTER — Ambulatory Visit (INDEPENDENT_AMBULATORY_CARE_PROVIDER_SITE_OTHER): Payer: BC Managed Care – PPO | Admitting: Family Medicine

## 2022-02-08 VITALS — BP 110/60 | HR 86 | Ht 65.0 in | Wt 205.0 lb

## 2022-02-08 DIAGNOSIS — G4733 Obstructive sleep apnea (adult) (pediatric): Secondary | ICD-10-CM | POA: Diagnosis not present

## 2022-02-08 DIAGNOSIS — F419 Anxiety disorder, unspecified: Secondary | ICD-10-CM

## 2022-02-08 DIAGNOSIS — F32A Depression, unspecified: Secondary | ICD-10-CM

## 2022-02-08 DIAGNOSIS — R5382 Chronic fatigue, unspecified: Secondary | ICD-10-CM

## 2022-02-08 MED ORDER — SERTRALINE HCL 100 MG PO TABS: 100.0000 mg | ORAL_TABLET | Freq: Every day | ORAL | 0 refills | Status: DC

## 2022-02-08 NOTE — Progress Notes (Signed)
     Primary Care / Sports Medicine Office Visit  Patient Information:  Patient ID: Lynn Zuniga, female DOB: 01-Oct-1985 Age: 36 y.o. MRN: [id number]   Lynn Zuniga is a pleasant 36 y.o. female presenting with the following:  Chief Complaint  Patient presents with   Follow-up    States she is feeling a little better since starting the medication. Still feels anxious when around a lot of people.  Fatigue is still the same, is tired a lot during the day.     Vitals:   02/08/22 1403  BP: 110/60  Pulse: 86  SpO2: 98%   Vitals:   02/08/22 1403  Weight: 205 lb (93 kg)  Height: 5\' 5"  (1.651 m)   Body mass index is 34.11 kg/m.  No results found.   Independent interpretation of notes and tests performed by another provider:   None  Procedures performed:   None  Pertinent History, Exam, Impression, and Recommendations:   Problem List Items Addressed This Visit       Respiratory   OSA (obstructive sleep apnea) - Primary    Recent home sleep study does confirm mild-moderate obstructive sleep apnea.  We have sent for CPAP with her corresponding information.  For recalcitrant symptomatology or issues with obtaining CPAP, referral to sleep medicine to be placed.  We will coordinate a follow-up in 3 months for reevaluation to assess her fatigue response.        Other   Chronic fatigue    Recent serum studies have all been negative/reassuring.  Home sleep study results positive, will coordinate CPAP with patient.      Anxiety and depression    Patient has noted interval improvement from sertraline 50 mg, tolerating this well without issue, will titrate to 100 mg due to persistent symptomatology and have her return in 3 months for reevaluation.      Relevant Medications   sertraline (ZOLOFT) 100 MG tablet     Orders & Medications Meds ordered this encounter  Medications   sertraline (ZOLOFT) 100 MG tablet    Sig: Take 1 tablet (100 mg total) by mouth daily.     Dispense:  90 tablet    Refill:  0   No orders of the defined types were placed in this encounter.    Return in about 3 months (around 05/11/2022) for physical.     07/11/2022, MD   Primary Care Sports Medicine [facility]

## 2022-02-08 NOTE — Assessment & Plan Note (Signed)
Recent serum studies have all been negative/reassuring.  Home sleep study results positive, will coordinate CPAP with patient.

## 2022-02-08 NOTE — Assessment & Plan Note (Signed)
Patient has noted interval improvement from sertraline 50 mg, tolerating this well without issue, will titrate to 100 mg due to persistent symptomatology and have her return in 3 months for reevaluation.

## 2022-02-08 NOTE — Assessment & Plan Note (Signed)
Recent home sleep study does confirm mild-moderate obstructive sleep apnea.  We have sent for CPAP with her corresponding information.  For recalcitrant symptomatology or issues with obtaining CPAP, referral to sleep medicine to be placed.  We will coordinate a follow-up in 3 months for reevaluation to assess her fatigue response.

## 2022-05-11 ENCOUNTER — Telehealth: Payer: Self-pay | Admitting: Family Medicine

## 2022-05-11 ENCOUNTER — Encounter: Payer: Self-pay | Admitting: Family Medicine

## 2022-05-11 ENCOUNTER — Ambulatory Visit (INDEPENDENT_AMBULATORY_CARE_PROVIDER_SITE_OTHER): Payer: BC Managed Care – PPO | Admitting: Family Medicine

## 2022-05-11 VITALS — BP 114/84 | HR 50 | Ht 65.0 in | Wt 207.0 lb

## 2022-05-11 DIAGNOSIS — F419 Anxiety disorder, unspecified: Secondary | ICD-10-CM | POA: Diagnosis not present

## 2022-05-11 DIAGNOSIS — R5382 Chronic fatigue, unspecified: Secondary | ICD-10-CM | POA: Diagnosis not present

## 2022-05-11 DIAGNOSIS — Z13220 Encounter for screening for lipoid disorders: Secondary | ICD-10-CM | POA: Diagnosis not present

## 2022-05-11 DIAGNOSIS — F32A Depression, unspecified: Secondary | ICD-10-CM

## 2022-05-11 DIAGNOSIS — Z0000 Encounter for general adult medical examination without abnormal findings: Secondary | ICD-10-CM | POA: Diagnosis not present

## 2022-05-11 DIAGNOSIS — Z1159 Encounter for screening for other viral diseases: Secondary | ICD-10-CM | POA: Diagnosis not present

## 2022-05-11 DIAGNOSIS — G4733 Obstructive sleep apnea (adult) (pediatric): Secondary | ICD-10-CM | POA: Diagnosis not present

## 2022-05-11 MED ORDER — BUSPIRONE HCL 5 MG PO TABS: 5.0000 mg | ORAL_TABLET | Freq: Two times a day (BID) | ORAL | 0 refills | Status: DC

## 2022-05-11 MED ORDER — SERTRALINE HCL 100 MG PO TABS: 100.0000 mg | ORAL_TABLET | Freq: Every day | ORAL | 0 refills | Status: DC

## 2022-05-11 NOTE — Assessment & Plan Note (Signed)
Chronic, stable, awaiting treatment for OSA.

## 2022-05-11 NOTE — Telephone Encounter (Signed)
Copied from CRM [id number]. Topic: General - Inquiry >> May 11, 2022  8:30 AM De Blanch wrote: Reason for CRM: Pt stated she is running behind for the appt. Pt made aware of late policy.

## 2022-05-11 NOTE — Assessment & Plan Note (Signed)
Patient has been tolerating sertraline 100 mg well, does feel that mood is not fully controlled, PHQ does reveal subtle increase compared to last visit but still lower than initial visit, GAD reassuring.  Discussed various treatment strategies including titration of current sertraline dose, she is amenable to augmentation with buspirone, 5 mg twice daily, further titration by 5 mg weekly with cut off at 60 mg daily.

## 2022-05-11 NOTE — Progress Notes (Signed)
Annual Physical Exam Visit  Patient Information:  Patient ID: Lynn Zuniga, female DOB: August 13, 1986 Age: 36 y.o. MRN: [id number]   Subjective:   CC: Annual Physical Exam  HPI:  Lynn Zuniga is here for their annual physical.  I reviewed the past medical history, family history, social history, surgical history, and allergies today and changes were made as necessary.  Please see the problem list section below for additional details.  Past Medical History: Past Medical History:  Diagnosis Date   Menorrhagia with regular cycle 11/19/2016   very light to nonexistent monthly period flow   Past Surgical History: Past Surgical History:  Procedure Laterality Date   DENTAL RESTORATION/EXTRACTION WITH X-RAY     ENDOMETRIAL ABLATION  2022   TUBAL LIGATION  2012   Family History: Family History  Problem Relation Age of Onset   Lung cancer Maternal Aunt    Brain cancer Maternal Aunt    Breast cancer Paternal Grandmother    Allergies: No Known Allergies Health Maintenance: Health Maintenance  Topic Date Due   INFLUENZA VACCINE  12/02/2022 (Originally 04/03/2022)   Hepatitis C Screening  05/12/2023 (Originally 02/04/2004)   PAP SMEAR-Modifier  12/12/2024   TETANUS/TDAP  01/06/2031   HIV Screening  Completed   HPV VACCINES  Aged Out   COVID-19 Vaccine  Discontinued    HM Colonoscopy     This patient has no relevant Health Maintenance data.      Medications: No current outpatient medications on file prior to visit.   No current facility-administered medications on file prior to visit.    Review of Systems: No headache, visual changes, nausea, vomiting, diarrhea, constipation, dizziness, abdominal pain, skin rash, fevers, chills, night sweats, swollen lymph nodes, weight loss, chest pain, body aches, joint swelling, muscle aches, shortness of breath, mood changes, visual or auditory hallucinations reported.  Objective:   Vitals:   05/11/22 0846  BP: 114/84   Pulse: (!) 50  SpO2: 98%   Vitals:   05/11/22 0846  Weight: 207 lb (93.9 kg)  Height: 5\' 5"  (1.651 m)   Body mass index is 34.45 kg/m.  General: Well Developed, well nourished, and in no acute distress.  Neuro: Alert and oriented x3, extra-ocular muscles intact, sensation grossly intact. Cranial nerves II through XII are grossly intact, motor, sensory, and coordinative functions are intact. HEENT: Normocephalic, atraumatic, pupils equal round reactive to light, neck supple, no masses, no lymphadenopathy, thyroid nonpalpable. Oropharynx, nasopharynx, external ear canals are unremarkable. Skin: Warm and dry, no rashes noted.  Cardiac: Regular rate and rhythm, no murmurs rubs or gallops. No peripheral edema. Pulses symmetric. Respiratory: Clear to auscultation bilaterally. Not using accessory muscles, speaking in full sentences.  Abdominal: Soft, nontender, nondistended, positive bowel sounds, no masses, no organomegaly. Musculoskeletal: Shoulder, elbow, wrist, hip, knee, ankle stable, and with full range of motion.  Female chaperone initials: KP present throughout the physical examination.  Impression and Recommendations:   The patient was counselled, risk factors were discussed, and anticipatory guidance given.  Problem List Items Addressed This Visit       Respiratory   OSA (obstructive sleep apnea)    Diagnosed on home sleep study in the setting of chronic fatigue.  Has not had a chance to obtain CPAP due to insurance/financial barriers, confusion thereof.  Discussed the same with the patient, importance of using CPAP given diagnosis.  She is amenable to a referral to sleep medicine for their input on coordinating getting this patient a CPAP.  Relevant Orders   Ambulatory referral to Sleep Studies     Other   Chronic fatigue    Chronic, stable, awaiting treatment for OSA.      Anxiety and depression    Patient has been tolerating sertraline 100 mg well, does feel  that mood is not fully controlled, PHQ does reveal subtle increase compared to last visit but still lower than initial visit, GAD reassuring.  Discussed various treatment strategies including titration of current sertraline dose, she is amenable to augmentation with buspirone, 5 mg twice daily, further titration by 5 mg weekly with cut off at 60 mg daily.      Relevant Medications   sertraline (ZOLOFT) 100 MG tablet   busPIRone (BUSPAR) 5 MG tablet   Annual physical exam - Primary    Annual examination completed, risk stratification labs ordered, anticipatory guidance provided.  We will follow labs once resulted.      Relevant Orders   Lipid panel   Other Visit Diagnoses     Screening for lipoid disorders       Relevant Orders   Lipid panel   Need for hepatitis C screening test       Relevant Orders   Hepatitis C antibody        Orders & Medications Medications:  Meds ordered this encounter  Medications   sertraline (ZOLOFT) 100 MG tablet    Sig: Take 1 tablet (100 mg total) by mouth daily.    Dispense:  90 tablet    Refill:  0   busPIRone (BUSPAR) 5 MG tablet    Sig: Take 1 tablet (5 mg total) by mouth 2 (two) times daily. Can increase by 1 tablet (5 mg) every week at evenly spaced dose, do not exceed 60 mg / day total dose    Dispense:  180 tablet    Refill:  0   Orders Placed This Encounter  Procedures   Lipid panel   Hepatitis C antibody   Ambulatory referral to Sleep Studies     Return in about 3 months (around 08/10/2022).    Lynn Banana, MD   Primary Care Sports Medicine [facility]

## 2022-05-11 NOTE — Patient Instructions (Signed)
-   Obtain fasting labs with orders provided (can have water or black coffee but otherwise no food or drink x 8 hours before labs) - Review information provided - Attend eye doctor annually, dentist every 6 months, work towards or maintain 30 minutes of moderate intensity physical activity at least 5 days per week, and consume a balanced diet - Return in 3 months - Contact us for any questions between now and then 

## 2022-05-11 NOTE — Assessment & Plan Note (Signed)
Annual examination completed, risk stratification labs ordered, anticipatory guidance provided.  We will follow labs once resulted. 

## 2022-05-11 NOTE — Assessment & Plan Note (Signed)
Diagnosed on home sleep study in the setting of chronic fatigue.  Has not had a chance to obtain CPAP due to insurance/financial barriers, confusion thereof.  Discussed the same with the patient, importance of using CPAP given diagnosis.  She is amenable to a referral to sleep medicine for their input on coordinating getting this patient a CPAP.

## 2022-05-12 LAB — LIPID PANEL
Chol/HDL Ratio: 2.7 ratio (ref 0.0–4.4)
Cholesterol, Total: 132 mg/dL (ref 100–199)
HDL: 49 mg/dL (ref >39)
LDL Chol Calc (NIH): 64 mg/dL (ref 0–99)
Triglycerides: 100 mg/dL (ref 0–149)
VLDL Cholesterol Cal: 19 mg/dL (ref 5–40)

## 2022-05-12 LAB — HEPATITIS C ANTIBODY: Hep C Virus Ab: NONREACTIVE

## 2022-05-30 ENCOUNTER — Ambulatory Visit (INDEPENDENT_AMBULATORY_CARE_PROVIDER_SITE_OTHER): Payer: BC Managed Care – PPO | Admitting: Family Medicine

## 2022-05-30 ENCOUNTER — Encounter: Payer: Self-pay | Admitting: Family Medicine

## 2022-05-30 VITALS — BP 118/74 | HR 76 | Ht 65.0 in | Wt 207.0 lb

## 2022-05-30 DIAGNOSIS — L309 Dermatitis, unspecified: Secondary | ICD-10-CM | POA: Diagnosis not present

## 2022-05-30 MED ORDER — METHYLPREDNISOLONE 4 MG PO TBPK: ORAL_TABLET | ORAL | 0 refills | Status: DC

## 2022-05-30 NOTE — Patient Instructions (Signed)
-   Take steroids for full course - Can use Benadryl topical cream for additional symptom control - Contact our office for any persistent symptoms despite the steroids - Follow-up as-needed

## 2022-05-30 NOTE — Progress Notes (Signed)
     Primary Care / Sports Medicine Office Visit  Patient Information:  Patient ID: Lynn Zuniga, female DOB: 1985/09/11 Age: 36 y.o. MRN: [id number]   Lynn Zuniga is a pleasant 36 y.o. female presenting with the following:  Chief Complaint  Patient presents with   Rash    Pt stated she started to notice itching and rash over the weekend, has started to get in different areas, belly button, neck feet, arms.     Vitals:   05/30/22 0958  BP: 118/74  Pulse: 76  SpO2: 98%   Vitals:   05/30/22 0958  Weight: 207 lb (93.9 kg)  Height: 5\' 5"  (1.651 m)   Body mass index is 34.45 kg/m.  No results found.   Independent interpretation of notes and tests performed by another provider:   None  Procedures performed:   None  Pertinent History, Exam, Impression, and Recommendations:   Problem List Items Addressed This Visit       Musculoskeletal and Integument   Dermatitis - Primary    Less than 1 week history of progressive rash involving initially inguinal region, thighs, abdomen, inframammary, and posterior neck and shoulders.  Described as pruritic, erythematous, uncertain about any specific new exposures but did start herbalife supplement prior to symptoms, no treatments to date.  She denies any throat tightening, shortness of air.  Examination reveals regions of erythema at the posterior neck, shoulders, abdominal wall, inframammary regions, no skin breakdown, no urticaria.  Findings raise concern for contact/allergic dermatitis.  Discussed items to review and provided patient education, additionally was started on Medrol Dosepak course and was advised to contact us for any recurrent symptoms.      Relevant Medications   methylPREDNISolone (MEDROL DOSEPAK) 4 MG TBPK tablet     Orders & Medications Meds ordered this encounter  Medications   methylPREDNISolone (MEDROL DOSEPAK) 4 MG TBPK tablet    Sig: Take for full course per package instructions    Dispense:   21 tablet    Refill:  0   No orders of the defined types were placed in this encounter.    Return if symptoms worsen or fail to improve.     Montel Culver, MD   Primary Care Sports Medicine [place]

## 2022-05-30 NOTE — Assessment & Plan Note (Signed)
Less than 1 week history of progressive rash involving initially inguinal region, thighs, abdomen, inframammary, and posterior neck and shoulders.  Described as pruritic, erythematous, uncertain about any specific new exposures but did start herbalife supplement prior to symptoms, no treatments to date.  She denies any throat tightening, shortness of air.  Examination reveals regions of erythema at the posterior neck, shoulders, abdominal wall, inframammary regions, no skin breakdown, no urticaria.  Findings raise concern for contact/allergic dermatitis.  Discussed items to review and provided patient education, additionally was started on Medrol Dosepak course and was advised to contact us for any recurrent symptoms.

## 2022-08-10 ENCOUNTER — Encounter: Payer: Self-pay | Admitting: Family Medicine

## 2022-08-10 ENCOUNTER — Ambulatory Visit (INDEPENDENT_AMBULATORY_CARE_PROVIDER_SITE_OTHER): Payer: BC Managed Care – PPO | Admitting: Family Medicine

## 2022-08-10 VITALS — BP 120/80 | HR 88 | Ht 65.0 in | Wt 208.0 lb

## 2022-08-10 DIAGNOSIS — F32A Depression, unspecified: Secondary | ICD-10-CM | POA: Diagnosis not present

## 2022-08-10 DIAGNOSIS — F419 Anxiety disorder, unspecified: Secondary | ICD-10-CM | POA: Diagnosis not present

## 2022-08-10 MED ORDER — AUVELITY 45-105 MG PO TBCR: 1.0000 | EXTENDED_RELEASE_TABLET | Freq: Two times a day (BID) | ORAL | 0 refills | Status: DC

## 2022-08-10 MED ORDER — SERTRALINE HCL 100 MG PO TABS: 100.0000 mg | ORAL_TABLET | Freq: Every day | ORAL | 0 refills | Status: DC

## 2022-08-12 NOTE — Assessment & Plan Note (Signed)
Chronic condition, worsened, recent loss of close ones. This has been despite sertraline. Discussed options, plan for adjunct treatment with Auvelity and close follow-up.

## 2022-08-12 NOTE — Progress Notes (Signed)
     Primary Care / Sports Medicine Office Visit  Patient Information:  Patient ID: Lynn Zuniga, female DOB: [birth year] Age: 36 y.o. MRN: [id number]   Lynn Zuniga is a pleasant 36 y.o. female presenting with the following:  Chief Complaint  Patient presents with   anxiety/depression    States she takes her meds at afternoon but is feeling better, has moments.     Vitals:   08/10/22 1332  BP: 120/80  Pulse: 88  SpO2: 98%   Vitals:   08/10/22 1332  Weight: 208 lb (94.3 kg)  Height: 5\' 5"  (1.651 m)   Body mass index is 34.61 kg/m.  No results found.   Independent interpretation of notes and tests performed by another provider:   None  Procedures performed:   None  Pertinent History, Exam, Impression, and Recommendations:   Problem List Items Addressed This Visit       Other   Anxiety and depression - Primary    Chronic condition, worsened, recent loss of close ones. This has been despite sertraline. Discussed options, plan for adjunct treatment with Auvelity and close follow-up.      Relevant Medications   Dextromethorphan-buPROPion ER (AUVELITY) 45-105 MG TBCR   sertraline (ZOLOFT) 100 MG tablet     Orders & Medications Meds ordered this encounter  Medications   Dextromethorphan-buPROPion ER (AUVELITY) 45-105 MG TBCR    Sig: Take 1 tablet by mouth 2 (two) times daily.    Dispense:  180 tablet    Refill:  0   sertraline (ZOLOFT) 100 MG tablet    Sig: Take 1 tablet (100 mg total) by mouth daily.    Dispense:  90 tablet    Refill:  0   No orders of the defined types were placed in this encounter.    No follow-ups on file.     , MD, [facility]   Primary Care Sports Medicine Primary Care and Sports Medicine at [facility]

## 2022-09-05 ENCOUNTER — Ambulatory Visit (INDEPENDENT_AMBULATORY_CARE_PROVIDER_SITE_OTHER): Payer: BC Managed Care – PPO

## 2022-09-05 ENCOUNTER — Other Ambulatory Visit (HOSPITAL_COMMUNITY)
Admission: RE | Admit: 2022-09-05 | Discharge: 2022-09-05 | Disposition: A | Discharge status: Home / Self Care | Payer: BC Managed Care – PPO | Source: Ambulatory Visit | Attending: Obstetrics | Admitting: Obstetrics

## 2022-09-05 VITALS — BP 104/70 | HR 76 | Resp 16 | Wt 211.8 lb

## 2022-09-05 DIAGNOSIS — Z202 Contact with and (suspected) exposure to infections with a predominantly sexual mode of transmission: Secondary | ICD-10-CM

## 2022-09-05 NOTE — Progress Notes (Signed)
Sexually Transmitted Disease Check Patient presents for sexually transmitted disease check. Sexual history reviewed with the patient. STD exposure: denies knowledge of risky exposure.  Previous history of STD:  unknown of STD, patient states that she had STD years ago but unsure of which one. Current symptoms include none.  Contraception: none.   Nunzio Cory. W, NCMA

## 2022-09-05 NOTE — Patient Instructions (Signed)
Safe Sex Practicing safe sex means taking steps before and during sex to reduce your risk of: Getting an STI (sexually transmitted infection). Giving your partner an STI. Unwanted or unplanned pregnancy. How to practice safe sex Ways you can practice safe sex  Limit your sexual partners to only one partner who is having sex with only you. Avoid using alcohol and drugs before having sex. Alcohol and drugs can affect your judgment. Before having sex with a new partner: Talk to your partner about past partners, past STIs, and drug use. Get screened for STIs and discuss the results with your partner. Ask your partner to get screened too. Check your body regularly for sores, blisters, rashes, or unusual discharge. If you notice any of these problems, visit your health care provider. Avoid sexual contact if you have symptoms of an infection or you are being treated for an STI. While having sex, use a condom. Make sure to: Use a condom every time you have vaginal, oral, or anal sex. Both females and males should wear condoms during oral sex. Keep condoms in place from the beginning to the end of sexual activity. Use a latex condom, if possible. Latex condoms offer the best protection. Use only water-based lubricants with a condom. Using petroleum-based lubricants or oils will weaken the condom and increase the chance that it will break. Ways your health care provider can help you practice safe sex  See your health care provider for regular screenings, exams, and tests for STIs. Talk with your health care provider about what kind of birth control (contraception) is best for you. Get vaccinated against hepatitis B and human papillomavirus (HPV). If you are at risk of being infected with HIV (human immunodeficiency virus), talk with your health care provider about taking a prescription medicine to prevent HIV infection. You are at risk for HIV if you: Are a man who has sex with other men. Are  sexually active with more than one partner. Take drugs by injection. Have a sex partner who has HIV. Have unprotected sex. Have sex with someone who has sex with both men and women. Have had an STI. Follow these instructions at home: Take over-the-counter and prescription medicines only as told by your health care provider. Keep all follow-up visits. This is important. Where to find more information Centers for Disease Control and Prevention: www.cdc.gov Planned Parenthood: www.plannedparenthood.org Office on Women's Health: www.womenshealth.gov Summary Practicing safe sex means taking steps before and during sex to reduce your risk getting an STI, giving your partner an STI, and having an unwanted or unplanned pregnancy. Before having sex with a new partner, talk to your partner about past partners, past STIs, and drug use. Use a condom every time you have vaginal, oral, or anal sex. Both females and males should wear condoms during oral sex. Check your body regularly for sores, blisters, rashes, or unusual discharge. If you notice any of these problems, visit your health care provider. See your health care provider for regular screenings, exams, and tests for STIs. This information is not intended to replace advice given to you by your health care provider. Make sure you discuss any questions you have with your health care provider. Document Revised: 01/25/2020 Document Reviewed: 01/25/2020 Elsevier Patient Education  2023 Elsevier Inc.  

## 2022-09-06 DIAGNOSIS — F411 Generalized anxiety disorder: Secondary | ICD-10-CM | POA: Diagnosis not present

## 2022-09-06 DIAGNOSIS — G4733 Obstructive sleep apnea (adult) (pediatric): Secondary | ICD-10-CM | POA: Diagnosis not present

## 2022-09-06 DIAGNOSIS — E669 Obesity, unspecified: Secondary | ICD-10-CM | POA: Diagnosis not present

## 2022-09-07 LAB — CERVICOVAGINAL ANCILLARY ONLY
Bacterial Vaginitis (gardnerella): POSITIVE — AB
Candida Glabrata: NEGATIVE
Candida Vaginitis: NEGATIVE
Chlamydia: NEGATIVE
Comment: NEGATIVE
Comment: NEGATIVE
Comment: NEGATIVE
Comment: NEGATIVE
Comment: NEGATIVE
Comment: NORMAL
Neisseria Gonorrhea: NEGATIVE
Trichomonas: NEGATIVE

## 2022-09-08 ENCOUNTER — Other Ambulatory Visit: Payer: Self-pay | Admitting: Advanced Practice Midwife

## 2022-09-08 DIAGNOSIS — N760 Acute vaginitis: Secondary | ICD-10-CM

## 2022-09-08 DIAGNOSIS — B3731 Acute candidiasis of vulva and vagina: Secondary | ICD-10-CM

## 2022-09-08 MED ORDER — METRONIDAZOLE 500 MG PO TABS: 500.0000 mg | ORAL_TABLET | Freq: Two times a day (BID) | ORAL | 0 refills | Status: AC

## 2022-09-08 MED ORDER — FLUCONAZOLE 150 MG PO TABS: 150.0000 mg | ORAL_TABLET | Freq: Once | ORAL | 1 refills | Status: AC

## 2022-09-08 NOTE — Progress Notes (Signed)
Message to patient regarding bv and rx's sent 

## 2022-09-11 ENCOUNTER — Telehealth: Payer: Self-pay

## 2022-09-11 NOTE — Telephone Encounter (Signed)
PA completed waiting on insurance approval.  Key: [id number]  KP

## 2022-09-12 NOTE — Telephone Encounter (Signed)
Pt needs to try and fail 3 alternatives.  Pt has only tried sertraline.  KP

## 2022-09-14 NOTE — Telephone Encounter (Signed)
LVM for pt to call back.

## 2022-09-21 ENCOUNTER — Ambulatory Visit (INDEPENDENT_AMBULATORY_CARE_PROVIDER_SITE_OTHER): Payer: BC Managed Care – PPO | Admitting: Family Medicine

## 2022-09-21 ENCOUNTER — Encounter: Payer: Self-pay | Admitting: Family Medicine

## 2022-09-21 VITALS — BP 128/78 | HR 68 | Ht 65.0 in | Wt 212.0 lb

## 2022-09-21 DIAGNOSIS — F32A Depression, unspecified: Secondary | ICD-10-CM | POA: Diagnosis not present

## 2022-09-21 DIAGNOSIS — F419 Anxiety disorder, unspecified: Secondary | ICD-10-CM | POA: Diagnosis not present

## 2022-09-21 DIAGNOSIS — G4733 Obstructive sleep apnea (adult) (pediatric): Secondary | ICD-10-CM | POA: Diagnosis not present

## 2022-09-21 DIAGNOSIS — Z7689 Persons encountering health services in other specified circumstances: Secondary | ICD-10-CM | POA: Diagnosis not present

## 2022-09-21 MED ORDER — SERTRALINE HCL 100 MG PO TABS: 100.0000 mg | ORAL_TABLET | Freq: Every day | ORAL | 0 refills | Status: DC

## 2022-09-21 MED ORDER — SAXENDA 18 MG/3ML ~~LOC~~ SOPN: PEN_INJECTOR | SUBCUTANEOUS | 0 refills | Status: DC

## 2022-09-21 MED ORDER — AUVELITY 45-105 MG PO TBCR: 1.0000 | EXTENDED_RELEASE_TABLET | Freq: Two times a day (BID) | ORAL | 2 refills | Status: DC

## 2022-09-21 NOTE — Assessment & Plan Note (Signed)
Did have an opportunity to establish with sleep medicine, they recommended CPAP, has not been able to start CPAP yet as she is awaiting input from their office on device itself.  Did discuss the interplay of OSA, mood, and weight.  Will follow peripherally on this issue.

## 2022-09-21 NOTE — Assessment & Plan Note (Signed)
Patient has been tolerating both sertraline 100 mg and newly started Auvelity.  She has noted, particular for the past few days, significant improvements in mood.  Additionally, less frequent nighttime awakenings, overall progress.  She denies any medication related issues.  I have discussed the nature of the medication she is on, timeline for symptom response that does correlate with her noted improvement.  At this stage she is to continue current regimen, new Rx refills provided, and we will coordinate follow-up in a few months for reassessment.

## 2022-09-21 NOTE — Assessment & Plan Note (Signed)
Chronic condition in the setting of comorbid OSA, noted despite lifestyle modifications.  We discussed the importance of treating OSA (awaiting CPAP from sleep medicine), and over the interim we will initiate liraglutide for weight management, dosing guidelines discussed.  If any insurance related issues noted, alternate Rx to be coordinated.

## 2022-09-21 NOTE — Progress Notes (Signed)
Primary Care / Sports Medicine Office Visit  Patient Information:  Patient ID: Lynn Zuniga, female DOB: 10/31/1985 Age: 37 y.o. MRN: [id number]   Lynn Zuniga is a pleasant 37 y.o. female presenting with the following:  Chief Complaint  Patient presents with   Anxiety and depression    Vitals:   09/21/22 0955  BP: 128/78  Pulse: 68  SpO2: 98%   Vitals:   09/21/22 0955  Weight: 212 lb (96.2 kg)  Height: 5\' 5"  (1.651 m)   Body mass index is 35.28 kg/m.  No results found.   Independent interpretation of notes and tests performed by another provider:   None  Procedures performed:   None  Pertinent History, Exam, Impression, and Recommendations:   Lynn Zuniga was seen today for anxiety and depression.  Anxiety and depression Overview:    08/10/2022    1:57 PM 05/11/2022    8:48 AM 02/08/2022    2:06 PM 01/11/2022    2:18 PM  Depression screen PHQ 2/9  Decreased Interest 1 1 1 2   Down, Depressed, Hopeless 0 0 0 1  PHQ - 2 Score 1 1 1 3   Altered sleeping 2 3 1 2   Tired, decreased energy 1 2 1 3   Change in appetite 1 2 1 2   Feeling bad or failure about yourself  0 0 0 0  Trouble concentrating 1 1 2 3   Moving slowly or fidgety/restless 0 0 0 1  Suicidal thoughts 0 0 0 0  PHQ-9 Score 6 9 6 14   Difficult doing work/chores Not difficult at all Somewhat difficult Somewhat difficult Very difficult      08/10/2022    1:57 PM 05/11/2022    8:48 AM 02/08/2022    2:07 PM 01/11/2022    2:19 PM  GAD 7 : Generalized Anxiety Score  Nervous, Anxious, on Edge 0 0 1 3  Control/stop worrying 0 0 0 3  Worry too much - different things 0 0 0 3  Trouble relaxing 0 1 0 2  Restless 0 0 0 1  Easily annoyed or irritable 0 0 0 1  Afraid - awful might happen 0 0 0 0  Total GAD 7 Score 0 1 1 13   Anxiety Difficulty Not difficult at all Not difficult at all Not difficult at all Somewhat difficult     Assessment & Plan: Patient has been tolerating both sertraline 100 mg and  newly started Auvelity.  She has noted, particular for the past few days, significant improvements in mood.  Additionally, less frequent nighttime awakenings, overall progress.  She denies any medication related issues.  I have discussed the nature of the medication she is on, timeline for symptom response that does correlate with her noted improvement.  At this stage she is to continue current regimen, new Rx refills provided, and we will coordinate follow-up in a few months for reassessment.  Orders: -     Sertraline HCl; Take 1 tablet (100 mg total) by mouth daily.  Dispense: 90 tablet; Refill: 0 -     Auvelity; Take 1 tablet by mouth 2 (two) times daily.  Dispense: 180 tablet; Refill: 2  Encounter for weight management Assessment & Plan: Chronic condition in the setting of comorbid OSA, noted despite lifestyle modifications.  We discussed the importance of treating OSA (awaiting CPAP from sleep medicine), and over the interim we will initiate liraglutide for weight management, dosing guidelines discussed.  If any insurance related issues noted, alternate Rx to  be coordinated.  Orders: -     Saxenda; 0.6 mg inj subcut daily for 1 week, then incr by 0.6 mg weekly until reaching 3 mg injected subcut daily  Dispense: 3 mL; Refill: 0  OSA (obstructive sleep apnea) Assessment & Plan: Did have an opportunity to establish with sleep medicine, they recommended CPAP, has not been able to start CPAP yet as she is awaiting input from their office on device itself.  Did discuss the interplay of OSA, mood, and weight.  Will follow peripherally on this issue.      Orders & Medications Meds ordered this encounter  Medications   DISCONTD: Dextromethorphan-buPROPion ER (AUVELITY) 45-105 MG TBCR    Sig: Take 1 tablet by mouth 2 (two) times daily.    Dispense:  180 tablet    Refill:  2   sertraline (ZOLOFT) 100 MG tablet    Sig: Take 1 tablet (100 mg total) by mouth daily.    Dispense:  90 tablet     Refill:  0   Liraglutide -Weight Management (SAXENDA) 18 MG/3ML SOPN    Sig: 0.6 mg inj subcut daily for 1 week, then incr by 0.6 mg weekly until reaching 3 mg injected subcut daily    Dispense:  3 mL    Refill:  0    Patient will have discount coupon, please include novofine needles QS   Dextromethorphan-buPROPion ER (AUVELITY) 45-105 MG TBCR    Sig: Take 1 tablet by mouth 2 (two) times daily.    Dispense:  180 tablet    Refill:  2   No orders of the defined types were placed in this encounter.    Return in about 3 months (around 12/21/2022) for follow-up medications / weight.     Montel Culver, MD, [facility]   Primary Care Sports Medicine Primary Care and Sports Medicine at [facility]

## 2022-09-21 NOTE — Patient Instructions (Addendum)
-  Continue current medications - Start Saxenda (if any coverage issues, contact our office for next steps) and follow dosing calendar below - Please contact sleep medicine using number below and ask about mask/CPAP status [facility] Address: [address] [place], [place] [address] Phone: [phone] - Return in 3 months

## 2022-09-24 ENCOUNTER — Telehealth: Payer: Self-pay | Admitting: Family Medicine

## 2022-09-24 NOTE — Telephone Encounter (Signed)
Faxed

## 2022-09-24 NOTE — Telephone Encounter (Signed)
Copied from [place] [id number]. Topic: General - Inquiry >> Sep 24, 2022 10:53 AM Erskine Squibb wrote: Reason for CRM: Lauren with [facility] with whom Dr Zigmund Daniel referred the patient to is requesting a copy of the most recent sleep study the patient had done. Please fax to [phone]

## 2022-09-24 NOTE — Telephone Encounter (Signed)
Copied from CRM #[id number]. Topic: General - Inquiry >> Sep 24, 2022 10:53 AM Jason S wrote: Reason for CRM: Lauren with [facility] with whom Dr Matthews referred the patient to is requesting a copy of the most recent sleep study the patient had done. Please fax to [phone] 

## 2022-10-03 DIAGNOSIS — G4733 Obstructive sleep apnea (adult) (pediatric): Secondary | ICD-10-CM | POA: Diagnosis not present

## 2022-11-01 DIAGNOSIS — G4733 Obstructive sleep apnea (adult) (pediatric): Secondary | ICD-10-CM | POA: Diagnosis not present

## 2022-11-15 ENCOUNTER — Telehealth: Payer: Self-pay | Admitting: Family Medicine

## 2022-11-15 NOTE — Telephone Encounter (Signed)
Copied from [place] [id number]. Topic: General - Other >> Nov 15, 2022  8:48 AM [place] B wrote: Reason for CRM: Tinna from [facility] called in says needs office notes pertaining to patient needing the cpap machine. Fx# [phone]

## 2022-12-02 DIAGNOSIS — G4733 Obstructive sleep apnea (adult) (pediatric): Secondary | ICD-10-CM | POA: Diagnosis not present

## 2022-12-09 ENCOUNTER — Other Ambulatory Visit: Payer: Self-pay | Admitting: Family Medicine

## 2022-12-09 DIAGNOSIS — F419 Anxiety disorder, unspecified: Secondary | ICD-10-CM

## 2022-12-11 NOTE — Telephone Encounter (Signed)
Requested Prescriptions  Pending Prescriptions Disp Refills   sertraline (ZOLOFT) 100 MG tablet [Pharmacy Med Name: Sertraline HCl 100 MG Oral Tablet] 90 tablet 0    Sig: Take 1 tablet by mouth once daily     Psychiatry:  Antidepressants - SSRI - sertraline Passed - 12/09/2022  7:55 AM      Passed - AST in normal range and within 360 days    AST  Date Value Ref Range Status  01/11/2022 25 0 - 40 IU/L Final         Passed - ALT in normal range and within 360 days    ALT  Date Value Ref Range Status  01/11/2022 17 0 - 32 IU/L Final         Passed - Completed PHQ-2 or PHQ-9 in the last 360 days      Passed - Valid encounter within last 6 months    Recent Outpatient Visits           2 months ago Anxiety and depression   [place] [facility], Ocie Bob, MD   4 months ago Anxiety and depression   [place] [facility], Ocie Bob, MD   6 months ago Dermatitis   [facility] at [facility], Ocie Bob, MD   7 months ago Annual physical exam   [facility] at [facility] Ashley Royalty, Ocie Bob, MD   10 months ago OSA (obstructive sleep apnea)   [facility] at [facility], Ocie Bob, MD       Future Appointments             In 1 week Ashley Royalty, Ocie Bob, MD [facility] Medicine at [facility], [facility]

## 2022-12-21 ENCOUNTER — Encounter: Payer: Self-pay | Admitting: Family Medicine

## 2022-12-21 ENCOUNTER — Ambulatory Visit (INDEPENDENT_AMBULATORY_CARE_PROVIDER_SITE_OTHER): Payer: BC Managed Care – PPO | Admitting: Family Medicine

## 2022-12-21 ENCOUNTER — Other Ambulatory Visit: Payer: Self-pay

## 2022-12-21 VITALS — BP 120/80 | HR 88 | Ht 65.0 in | Wt 202.0 lb

## 2022-12-21 DIAGNOSIS — G4733 Obstructive sleep apnea (adult) (pediatric): Secondary | ICD-10-CM

## 2022-12-21 DIAGNOSIS — F32A Depression, unspecified: Secondary | ICD-10-CM | POA: Diagnosis not present

## 2022-12-21 DIAGNOSIS — F419 Anxiety disorder, unspecified: Secondary | ICD-10-CM | POA: Diagnosis not present

## 2022-12-21 DIAGNOSIS — Z7689 Persons encountering health services in other specified circumstances: Secondary | ICD-10-CM | POA: Diagnosis not present

## 2022-12-21 MED ORDER — SAXENDA 18 MG/3ML ~~LOC~~ SOPN
0.6000 mg | PEN_INJECTOR | Freq: Every day | SUBCUTANEOUS | 0 refills | Status: DC
  Filled 2022-12-21: qty 15, 90d supply, fill #0
  Filled 2023-01-03 – 2023-01-04 (×2): qty 15, 150d supply, fill #0
  Filled 2023-01-20: qty 15, 30d supply, fill #0

## 2022-12-21 MED ORDER — SERTRALINE HCL 100 MG PO TABS: 100.0000 mg | ORAL_TABLET | Freq: Every day | ORAL | 2 refills | Status: DC

## 2022-12-23 NOTE — Assessment & Plan Note (Signed)
Doing well on current dose, maintain.

## 2022-12-23 NOTE — Assessment & Plan Note (Signed)
Could not get liraglutide, has been working on lifestyle changes with progress. Will reach out again for Rx, can contact for pharmacy change.

## 2022-12-23 NOTE — Progress Notes (Signed)
     Primary Care / Sports Medicine Office Visit  Patient Information:  Patient ID: Lynn Zuniga, female DOB: June 24, 1986 Age: 37 y.o. MRN: [id number]   Lynn Zuniga is a pleasant 37 y.o. female presenting with the following:  Chief Complaint  Patient presents with   Weight Check    Vitals:   12/21/22 0851  BP: 120/80  Pulse: 88  SpO2: 98%   Vitals:   12/21/22 0851  Weight: 202 lb (91.6 kg)  Height:  (1.651 m)   Body mass index is 33.61 kg/m.  No results found.   Independent interpretation of notes and tests performed by another provider:   None  Procedures performed:   None  Pertinent History, Exam, Impression, and Recommendations:   Lynn Zuniga was seen today for weight check.  Encounter for weight management Assessment & Plan: Could not get liraglutide, has been working on lifestyle changes with progress. Will reach out again for Rx, can contact for pharmacy change.   OSA on CPAP Assessment & Plan: Chronic, stable, has routine follow-up with Sleep Medicine upcoming.   Anxiety and depression Assessment & Plan: Doing well on current dose, maintain.  Orders: -     Sertraline HCl; Take 1 tablet (100 mg total) by mouth daily.  Dispense: 90 tablet; Refill: 2     Orders & Medications Meds ordered this encounter  Medications   sertraline (ZOLOFT) 100 MG tablet    Sig: Take 1 tablet (100 mg total) by mouth daily.    Dispense:  90 tablet    Refill:  2   No orders of the defined types were placed in this encounter.    Return in about 5 months (around 05/23/2023) for CPE.     Jerrol Banana, MD, [facility]   Primary Care Sports Medicine Primary Care and Sports Medicine at [facility]

## 2022-12-23 NOTE — Assessment & Plan Note (Signed)
Chronic, stable, has routine follow-up with Sleep Medicine upcoming.

## 2022-12-25 ENCOUNTER — Other Ambulatory Visit: Payer: Self-pay

## 2023-01-01 DIAGNOSIS — G4733 Obstructive sleep apnea (adult) (pediatric): Secondary | ICD-10-CM | POA: Diagnosis not present

## 2023-01-03 ENCOUNTER — Other Ambulatory Visit: Payer: Self-pay

## 2023-01-03 DIAGNOSIS — F419 Anxiety disorder, unspecified: Secondary | ICD-10-CM | POA: Diagnosis not present

## 2023-01-03 DIAGNOSIS — G4733 Obstructive sleep apnea (adult) (pediatric): Secondary | ICD-10-CM | POA: Diagnosis not present

## 2023-01-04 ENCOUNTER — Other Ambulatory Visit: Payer: Self-pay

## 2023-01-04 DIAGNOSIS — G4733 Obstructive sleep apnea (adult) (pediatric): Secondary | ICD-10-CM

## 2023-01-04 NOTE — Telephone Encounter (Signed)
Please advise 

## 2023-01-04 NOTE — Telephone Encounter (Signed)
Referral placed.

## 2023-01-07 DIAGNOSIS — G4733 Obstructive sleep apnea (adult) (pediatric): Secondary | ICD-10-CM | POA: Diagnosis not present

## 2023-01-17 NOTE — Telephone Encounter (Signed)
Lynn Zuniga With [facility]  [employer] prescriptions appeal.  She said they faxed the denial on May 2nd.  The reason for the denial was they needed a body max index before the weight loss therapy  CB#  [phone] FAX [phone]

## 2023-01-18 ENCOUNTER — Telehealth: Payer: Self-pay | Admitting: Family Medicine

## 2023-01-18 NOTE — Telephone Encounter (Signed)
faxed

## 2023-01-18 NOTE — Telephone Encounter (Signed)
Pattricia Boss (Clinical Pharmacists) with [facility] [employer] is calling stating that she is needing specific chart notes for the pt. She is needing the BMI (body mass index) for the pt;  most recent and also most recent chart notes.   On the Cover Letter for the fax please have pt name, dob, and the words additional  for fax saxenda.    fax: [phone] phone number: [phone] ext [phone]  9:30am-6p central

## 2023-01-20 ENCOUNTER — Other Ambulatory Visit: Payer: Self-pay

## 2023-01-21 ENCOUNTER — Other Ambulatory Visit (HOSPITAL_COMMUNITY): Payer: Self-pay

## 2023-01-21 ENCOUNTER — Other Ambulatory Visit: Payer: Self-pay

## 2023-01-21 MED ORDER — INSULIN PEN NEEDLE 31G X 5 MM MISC
0 refills | Status: DC
  Filled 2023-01-21: qty 100, 90d supply, fill #0

## 2023-01-31 DIAGNOSIS — G4733 Obstructive sleep apnea (adult) (pediatric): Secondary | ICD-10-CM | POA: Diagnosis not present

## 2023-02-01 DIAGNOSIS — G4733 Obstructive sleep apnea (adult) (pediatric): Secondary | ICD-10-CM | POA: Diagnosis not present

## 2023-03-03 ENCOUNTER — Other Ambulatory Visit: Payer: Self-pay

## 2023-03-03 ENCOUNTER — Emergency Department
Admission: EM | Admit: 2023-03-03 | Discharge: 2023-03-03 | Disposition: A | Discharge status: Home / Self Care | Payer: BC Managed Care – PPO | Attending: Emergency Medicine | Admitting: Emergency Medicine

## 2023-03-03 DIAGNOSIS — T63441A Toxic effect of venom of bees, accidental (unintentional), initial encounter: Secondary | ICD-10-CM | POA: Diagnosis not present

## 2023-03-03 DIAGNOSIS — G4733 Obstructive sleep apnea (adult) (pediatric): Secondary | ICD-10-CM | POA: Diagnosis not present

## 2023-03-03 DIAGNOSIS — M79672 Pain in left foot: Secondary | ICD-10-CM | POA: Diagnosis not present

## 2023-03-03 MED ORDER — IBUPROFEN 600 MG PO TABS
600.0000 mg | ORAL_TABLET | Freq: Once | ORAL | Status: AC
  Administered 2023-03-03: 600 mg via ORAL
  Filled 2023-03-03: qty 1

## 2023-03-03 MED ORDER — FAMOTIDINE 20 MG PO TABS: 20.0000 mg | ORAL_TABLET | Freq: Two times a day (BID) | ORAL | 11 refills | Status: DC

## 2023-03-03 MED ORDER — FAMOTIDINE 20 MG PO TABS
20.0000 mg | ORAL_TABLET | Freq: Once | ORAL | Status: AC
  Administered 2023-03-03: 20 mg via ORAL
  Filled 2023-03-03: qty 1

## 2023-03-03 MED ORDER — ACETAMINOPHEN 325 MG PO TABS
650.0000 mg | ORAL_TABLET | Freq: Once | ORAL | Status: AC
  Administered 2023-03-03: 650 mg via ORAL
  Filled 2023-03-03: qty 2

## 2023-03-03 NOTE — ED Triage Notes (Signed)
Pt to ed from home via POV for a bee sting to the left foot. Pt foot is swollen and she is unable to walk on it./ Pt has no other hives or rash or airway involvement at this time. Pt is caox4, in no acute distress in triage.

## 2023-03-03 NOTE — Discharge Instructions (Addendum)
Please ice, elevate and wrap the foot in an Ace bandage to compress it.  This will help reduce your swelling.  You can take the famotidine twice a day, once in the morning, once at night.   You can take 650 mg of Tylenol and 600 mg of ibuprofen every 6 hours as needed for pain.  If you develop any difficulty breathing, lip swelling, tongue swelling please return to the ED.  Watch for signs of infection, this would include increased erythema, warmth and purulent discharge.

## 2023-03-03 NOTE — ED Provider Notes (Signed)
[facility] Provider Note    Event Date/Time   First MD Initiated Contact with Patient 03/03/23 2123     (approximate)   History   Allergic Reaction (Wasp sting to foot)   HPI  Lynn Zuniga is a 37 y.o. female with no PMH who presents for evaluation of a bee sting to the left foot.  Patient said that her daughter saw a wasp sting her on the foot.  Since the sting, her foot has become swollen, warm and painful.  Patient is unable to bear weight.  She is not aware of an allergy to bees.  Patient denies any difficulty breathing, nausea, vomiting, lip or tongue swelling.  She took some Benadryl after the bee sting.      Physical Exam   Triage Vital Signs: ED Triage Vitals  Enc Vitals Group     BP 03/03/23 2108 136/76     Pulse Rate 03/03/23 2108 83     Resp 03/03/23 2108 16     Temp 03/03/23 2108 98 F (36.7 C)     Temp Source 03/03/23 2108 Oral     SpO2 03/03/23 2108 98 %     Weight 03/03/23 2109 200 lb 9.9 oz (91 kg)     Height 03/03/23 2109 5\' 5"  (1.651 m)     Head Circumference --      Peak Flow --      Pain Score 03/03/23 2108 8     Pain Loc --      Pain Edu? --      Excl. in GC? --     Most recent vital signs: Vitals:   03/03/23 2108 03/03/23 2200  BP: 136/76 118/64  Pulse: 83 78  Resp: 16 15  Temp: 98 F (36.7 C) 98 F (36.7 C)  SpO2: 98% 99%    General: Awake, no distress.  CV:  Good peripheral perfusion.  Resp:  Normal effort.  Abd:  No distention.  Left foot: Site of bee sting is not identifiable, there is no retained stinger, moderate amount of swelling when compared to the right, very TTP, distal pulse 2+ and regular, capillary refill appropriate in all toes, sensation intact across all dermatomes.  Other:  No lip or tongue swelling.   ED Results / Procedures / Treatments   Labs (all labs ordered are listed, but only abnormal results are displayed) Labs Reviewed - No data to display   PROCEDURES:  Critical  Care performed: No  Procedures   MEDICATIONS ORDERED IN ED: Medications  famotidine (PEPCID) tablet 20 mg (20 mg Oral Given 03/03/23 2158)  acetaminophen (TYLENOL) tablet 650 mg (650 mg Oral Given 03/03/23 2158)  ibuprofen (ADVIL) tablet 600 mg (600 mg Oral Given 03/03/23 2158)     IMPRESSION / MDM / ASSESSMENT AND PLAN / ED COURSE  I reviewed the triage vital signs and the nursing notes.                             37 year old female presents for evaluation of a bee sting to the left foot.  Vital signs stable in triage.  Patient is in no acute distress.  Left foot swollen and tender.  Differential diagnosis includes, but is not limited to, local reaction, anaphylaxis, cellulitis.  Patient's presentation is most consistent with acute, uncomplicated illness.  Patient is not presenting with any signs of anaphylaxis so I feel she is appropriate for outpatient management.  I explained to the patient that she is having a localized reaction to the bee sting.  Patient was given Tylenol and ibuprofen for her pain.  She was also given a dose of famotidine.  Patient was given crutches as she is unable to walk on her foot.I will prescribe an antihistamine. I explained that patient can also take Benadryl.  I explained that she will need to ice, elevate and compress her foot to decrease the swelling.  I also gave her a note for work.  I gave her return precautions and educated her on signs of anaphylaxis and wound infection. I encouraged her to return to the ED if she had any new or worsening symptoms.  Patient voiced understanding, was agreeable to plan and was stable at discharge.    FINAL CLINICAL IMPRESSION(S) / ED DIAGNOSES   Final diagnoses:  Bee sting, accidental or unintentional, initial encounter     Rx / DC Orders   ED Discharge Orders          Ordered    famotidine (PEPCID) 20 MG tablet  2 times daily        03/03/23 2153             Note:  This document was prepared using  Dragon voice recognition software and may include unintentional dictation errors.   Cameron Ali, PA-C 03/03/23 2330    Chesley Noon, MD 03/03/23 2352

## 2023-03-08 ENCOUNTER — Other Ambulatory Visit: Payer: Self-pay | Admitting: Family Medicine

## 2023-03-11 ENCOUNTER — Other Ambulatory Visit: Payer: Self-pay

## 2023-03-11 MED ORDER — SAXENDA 18 MG/3ML ~~LOC~~ SOPN
0.6000 mg | PEN_INJECTOR | Freq: Every day | SUBCUTANEOUS | 0 refills | Status: DC
  Filled 2023-03-11: qty 15, 30d supply, fill #0

## 2023-03-11 NOTE — Telephone Encounter (Signed)
Requested Prescriptions  Pending Prescriptions Disp Refills   Liraglutide -Weight Management (SAXENDA) 18 MG/3ML SOPN 15 mL 0    Sig: Inject 0.6 mg into the skin daily for 1 week, then increase by 0.6mg  weekly until reaching 3 mL daily     Endocrinology:  Diabetes - GLP-1 Receptor Agonists Failed - 03/08/2023  6:42 PM      Failed - HBA1C is between 0 and 7.9 and within 180 days    No results found for: "HGBA1C", "LABA1C"       Passed - Valid encounter within last 6 months    Recent Outpatient Visits           2 months ago Encounter for weight management   [place] [facility], Ocie Bob, MD   5 months ago Anxiety and depression   [place] [facility], Ocie Bob, MD   7 months ago Anxiety and depression   [facility] at [facility], Ocie Bob, MD   9 months ago Dermatitis   [facility] at [facility], Ocie Bob, MD   10 months ago Annual physical exam   [facility] at [facility], Ocie Bob, MD       Future Appointments             In 2 months Ashley Royalty, Ocie Bob, MD [facility] Primary Care & [facility] Medicine at [facility], [facility]

## 2023-03-22 ENCOUNTER — Other Ambulatory Visit: Payer: Self-pay

## 2023-04-03 DIAGNOSIS — G4733 Obstructive sleep apnea (adult) (pediatric): Secondary | ICD-10-CM | POA: Diagnosis not present

## 2023-04-05 ENCOUNTER — Telehealth: Payer: Self-pay | Admitting: Family Medicine

## 2023-04-05 NOTE — Telephone Encounter (Signed)
noted 

## 2023-04-05 NOTE — Telephone Encounter (Signed)
Liza with [employer] is calling in because they sent a request over for supplies on 04/03/23 and haven't heard anything back. Warden Fillers says she will refax the paperwork over to the office.

## 2023-04-17 DIAGNOSIS — G4733 Obstructive sleep apnea (adult) (pediatric): Secondary | ICD-10-CM | POA: Diagnosis not present

## 2023-05-04 DIAGNOSIS — G4733 Obstructive sleep apnea (adult) (pediatric): Secondary | ICD-10-CM | POA: Diagnosis not present

## 2023-05-18 DIAGNOSIS — G4733 Obstructive sleep apnea (adult) (pediatric): Secondary | ICD-10-CM | POA: Diagnosis not present

## 2023-05-21 ENCOUNTER — Ambulatory Visit (INDEPENDENT_AMBULATORY_CARE_PROVIDER_SITE_OTHER): Payer: BC Managed Care – PPO | Admitting: Family Medicine

## 2023-05-21 ENCOUNTER — Encounter: Payer: Self-pay | Admitting: Family Medicine

## 2023-05-21 VITALS — BP 110/76 | HR 86 | Ht 65.0 in | Wt 201.0 lb

## 2023-05-21 DIAGNOSIS — Z131 Encounter for screening for diabetes mellitus: Secondary | ICD-10-CM | POA: Diagnosis not present

## 2023-05-21 DIAGNOSIS — F419 Anxiety disorder, unspecified: Secondary | ICD-10-CM

## 2023-05-21 DIAGNOSIS — Z0000 Encounter for general adult medical examination without abnormal findings: Secondary | ICD-10-CM

## 2023-05-21 DIAGNOSIS — Z13220 Encounter for screening for lipoid disorders: Secondary | ICD-10-CM

## 2023-05-21 DIAGNOSIS — E559 Vitamin D deficiency, unspecified: Secondary | ICD-10-CM | POA: Diagnosis not present

## 2023-05-21 DIAGNOSIS — D649 Anemia, unspecified: Secondary | ICD-10-CM | POA: Diagnosis not present

## 2023-05-21 DIAGNOSIS — F32A Depression, unspecified: Secondary | ICD-10-CM

## 2023-05-21 DIAGNOSIS — Z7689 Persons encountering health services in other specified circumstances: Secondary | ICD-10-CM

## 2023-05-21 DIAGNOSIS — G4733 Obstructive sleep apnea (adult) (pediatric): Secondary | ICD-10-CM

## 2023-05-21 MED ORDER — TOPIRAMATE 25 MG PO TABS: 25.0000 mg | ORAL_TABLET | Freq: Two times a day (BID) | ORAL | 2 refills | Status: DC

## 2023-05-21 NOTE — Assessment & Plan Note (Signed)
Liraglutide was cost prohibitive, we reviewed alternate strategies and she is amenable to start the following:  - Topiramate 25 mg twice daily - Contact monthly if wanting dose increase - Return for in office visit in 3 months

## 2023-05-21 NOTE — Assessment & Plan Note (Signed)
Continues usage and notes benefit

## 2023-05-21 NOTE — Assessment & Plan Note (Addendum)
Has overall been stable with minor worsening of sleep over the past few weeks. Of note undergoing employment change.  -Work on sleep hygiene and trial of melatonin

## 2023-05-21 NOTE — Progress Notes (Signed)
Annual Physical Exam Visit  Patient Information:  Patient ID: CHEN MINSER, female DOB: 10-02-1985 Age: 37 y.o. MRN: [id number]   Subjective:   CC: Annual Physical Exam  HPI:  Arine Luks Sudduth is here for their annual physical.  I reviewed the past medical history, family history, social history, surgical history, and allergies today and changes were made as necessary.  Please see the problem list section below for additional details.  Past Medical History: Past Medical History:  Diagnosis Date   Anxiety    Depression    Menorrhagia with regular cycle 11/19/2016   very light to nonexistent monthly period flow   Sleep apnea    Past Surgical History: Past Surgical History:  Procedure Laterality Date   DENTAL RESTORATION/EXTRACTION WITH X-RAY     ENDOMETRIAL ABLATION  2022   TUBAL LIGATION  2012   Family History: Family History  Problem Relation Age of Onset   Lung cancer Maternal Aunt    Brain cancer Maternal Aunt    Cancer Maternal Grandmother    Cancer Maternal Grandfather    Breast cancer Paternal Grandmother    Cancer Paternal Grandmother    Allergies: No Known Allergies Health Maintenance: Health Maintenance  Topic Date Due   INFLUENZA VACCINE  12/02/2023 (Originally 04/04/2023)   Cervical Cancer Screening (HPV/Pap Cotest)  12/13/2026   DTaP/Tdap/Td (2 - Tdap) 01/06/2031   Hepatitis C Screening  Completed   HIV Screening  Completed   HPV VACCINES  Aged Out   COVID-19 Vaccine  Discontinued    HM Colonoscopy     This patient has no relevant Health Maintenance data.      Medications: Current Outpatient Medications on File Prior to Visit  Medication Sig Dispense Refill   Dextromethorphan-buPROPion ER (AUVELITY) 45-105 MG TBCR Take 1 tablet by mouth 2 (two) times daily. 180 tablet 2   famotidine (PEPCID) 20 MG tablet Take 1 tablet (20 mg total) by mouth 2 (two) times daily. 60 tablet 11   sertraline (ZOLOFT) 100 MG tablet Take 1 tablet (100 mg  total) by mouth daily. 90 tablet 2   No current facility-administered medications on file prior to visit.    Review of Systems: No headache, visual changes, nausea, vomiting, diarrhea, constipation, dizziness, abdominal pain, skin rash, fevers, chills, night sweats, swollen lymph nodes, weight loss, chest pain, body aches, joint swelling, muscle aches, shortness of breath, mood changes, visual or auditory hallucinations reported.  Objective:   Vitals:   05/21/23 1327  BP: 110/76  Pulse: 86  SpO2: 98%   Vitals:   05/21/23 1327  Weight: 201 lb (91.2 kg)  Height: 5\' 5"  (1.651 m)   Body mass index is 33.45 kg/m.  General: Well Developed, well nourished, and in no acute distress.  Neuro: Alert and oriented x3, extra-ocular muscles intact, sensation grossly intact. Cranial nerves II through XII are grossly intact, motor, sensory, and coordinative functions are intact. HEENT: Normocephalic, atraumatic, pupils equal round reactive to light, neck supple, no masses, no lymphadenopathy, thyroid nonpalpable. Oropharynx, nasopharynx, external ear canals are unremarkable. Skin: Warm and dry, no rashes noted.  Cardiac: Regular rate and rhythm, no murmurs rubs or gallops. No peripheral edema. Pulses symmetric. Respiratory: Clear to auscultation bilaterally. Not using accessory muscles, speaking in full sentences.  Abdominal: Soft, nontender, nondistended, positive bowel sounds, no masses, no organomegaly. Musculoskeletal: Shoulder, elbow, wrist, hip, knee, ankle stable, and with full range of motion.  Female chaperone initials: AS present throughout the physical examination.  Impression  and Recommendations:   The patient was counselled, risk factors were discussed, and anticipatory guidance given.  Problem List Items Addressed This Visit       Respiratory   OSA on CPAP    Continues usage and notes benefit        Other   Encounter for weight management    Liraglutide was cost  prohibitive, we reviewed alternate strategies and she is amenable to start the following:  - Topiramate 25 mg twice daily - Contact monthly if wanting dose increase - Return for in office visit in 3 months      Relevant Medications   topiramate (TOPAMAX) 25 MG tablet   Anxiety and depression    Has overall been stable with minor worsening of sleep over the past few weeks. Of note undergoing employment change.  -Work on sleep hygiene and trial of melatonin      Annual physical exam   Relevant Orders   CBC   Comprehensive metabolic panel   Hemoglobin A1c   Lipid panel   TSH   VITAMIN D 25 Hydroxy (Vit-D Deficiency, Fractures)   Iron, TIBC and Ferritin Panel   Anemia   Relevant Orders   CBC   Iron, TIBC and Ferritin Panel   Other Visit Diagnoses     Healthcare maintenance    -  Primary   Relevant Orders   CBC   Comprehensive metabolic panel   Hemoglobin A1c   Lipid panel   TSH   VITAMIN D 25 Hydroxy (Vit-D Deficiency, Fractures)   Screening for lipoid disorders       Relevant Orders   Comprehensive metabolic panel   Lipid panel   Vitamin D deficiency       Relevant Orders   VITAMIN D 25 Hydroxy (Vit-D Deficiency, Fractures)   Screening for diabetes mellitus       Relevant Orders   Hemoglobin A1c        Orders & Medications Medications:  Meds ordered this encounter  Medications   topiramate (TOPAMAX) 25 MG tablet    Sig: Take 1 tablet (25 mg total) by mouth 2 (two) times daily.    Dispense:  60 tablet    Refill:  2   Orders Placed This Encounter  Procedures   CBC   Comprehensive metabolic panel   Hemoglobin A1c   Lipid panel   TSH   VITAMIN D 25 Hydroxy (Vit-D Deficiency, Fractures)   Iron, TIBC and Ferritin Panel     No follow-ups on file.    Jerrol Banana, MD, [facility]   Primary Care Sports Medicine Primary Care and Sports Medicine at [facility]

## 2023-05-21 NOTE — Patient Instructions (Addendum)
-   Obtain fasting labs with orders provided (can have water or black coffee but otherwise no food or drink x 8 hours before labs) - Review information provided - Attend eye doctor annually, dentist every 6 months, work towards or maintain 30 minutes of moderate intensity physical activity at least 5 days per week, and consume a balanced diet - Return in 3 months - Contact us for any questions between now and then  Additionally: - Start topiramate 25 mg twice daily -  - Review information below regarding sleep hygiene - Trial melatonin 3 mg nightly (avoid light exposure after dosing)  Sleep hygiene advice  When possible, maximize regularity in activity and sleep schedule   Regularity in the timing of sleep, food intake, and social activity helps to stabilize the biological clock.Minimizing discrepancies in sleep timing between on-shift and off-shift periods may help you adapt to a fixed-shift schedule and may also help you adapt to each shift type in a rotating-shift schedule (depending onrotation speed and direction).   Create a sleep-friendly bedroom environment   Make sure that your bed is comfortable and that your bedroom is dark, quiet, and cool (around 54F or 18C).Blackout shades may be particularly important to block sunlight during daytime sleep. Creating constantbackground noise in the sleep environment with a fan or humidifier, for example, will eliminate unexpectedsounds that would otherwise wake you up.   Limit exposure to bright light before daytime sleep   Exposure to bright light (eg, sunlight during the morning commute home following a night shift) can bealerting and may also set your biological clock to a time that interferes with daytime sleep.   Make the last hour before bed a "wind-down" time   Engage in relaxing and pleasant activities, dim or block light in the room, and have a light snack.   Do not use alcohol to help you sleep and do not consume alcohol too close  to bedtime   Although alcohol may help you to fall asleep more easily, it disrupts your sleep during the night by causingfrequent awakenings. One drink of alcohol should not be consumed within three hours of bedtime.   Smoking and other drugs will disrupt your sleep   If you smoke, do not smoke too close to bedtime or if you wake up during the intended sleep period. Mostdrugs of abuse can disrupt sleep.   Avoid caffeinated products within six hours of bedtime   In addition to coffee, these may include tea, chocolate, and many sodas.   Exercise regularly, but avoid activities that raise body temperature close to bedtime   Regular exercise can improve sleep quality, but exercising or having a warm bath too close to bedtime candisrupt your ability to fall asleep. Warm baths should be avoided within 1.5 hours of bedtime.   Avoid consuming more than 8 to 10 ounces of liquids close to bedtime   A full or semi-full bladder can contribute to awakenings. Restrict liquids close to bedtime and empty yourbladder just before going to bed.

## 2023-05-23 ENCOUNTER — Encounter: Payer: BC Managed Care – PPO | Admitting: Family Medicine

## 2023-05-24 ENCOUNTER — Encounter: Payer: BC Managed Care – PPO | Admitting: Family Medicine

## 2023-05-30 ENCOUNTER — Encounter: Payer: Self-pay | Admitting: Family Medicine

## 2023-05-30 ENCOUNTER — Other Ambulatory Visit: Payer: Self-pay | Admitting: Family Medicine

## 2023-05-30 ENCOUNTER — Other Ambulatory Visit: Payer: Self-pay

## 2023-05-30 DIAGNOSIS — Z7689 Persons encountering health services in other specified circumstances: Secondary | ICD-10-CM

## 2023-05-30 MED ORDER — TOPIRAMATE 25 MG PO TABS: 25.0000 mg | ORAL_TABLET | Freq: Two times a day (BID) | ORAL | 0 refills | Status: DC

## 2023-06-03 DIAGNOSIS — G4733 Obstructive sleep apnea (adult) (pediatric): Secondary | ICD-10-CM | POA: Diagnosis not present

## 2023-06-17 DIAGNOSIS — G4733 Obstructive sleep apnea (adult) (pediatric): Secondary | ICD-10-CM | POA: Diagnosis not present

## 2023-06-26 ENCOUNTER — Encounter: Payer: Self-pay | Admitting: Family Medicine

## 2023-06-27 ENCOUNTER — Encounter: Payer: Self-pay | Admitting: Family Medicine

## 2023-06-27 NOTE — Telephone Encounter (Signed)
FYI  KP

## 2023-07-04 DIAGNOSIS — G4733 Obstructive sleep apnea (adult) (pediatric): Secondary | ICD-10-CM | POA: Diagnosis not present

## 2023-07-15 ENCOUNTER — Encounter: Payer: Self-pay | Admitting: Family Medicine

## 2023-07-15 NOTE — Telephone Encounter (Signed)
FYI  KP

## 2023-07-16 DIAGNOSIS — G4733 Obstructive sleep apnea (adult) (pediatric): Secondary | ICD-10-CM | POA: Diagnosis not present

## 2023-07-17 DIAGNOSIS — G4733 Obstructive sleep apnea (adult) (pediatric): Secondary | ICD-10-CM | POA: Diagnosis not present

## 2023-07-22 DIAGNOSIS — G4733 Obstructive sleep apnea (adult) (pediatric): Secondary | ICD-10-CM | POA: Diagnosis not present

## 2023-07-25 ENCOUNTER — Other Ambulatory Visit: Payer: Self-pay | Admitting: Family Medicine

## 2023-07-25 DIAGNOSIS — F32A Depression, unspecified: Secondary | ICD-10-CM

## 2023-07-26 NOTE — Telephone Encounter (Signed)
Requested medication (s) are due for refill today - yes  Requested medication (s) are on the active medication list -yes  Future visit scheduled -yes  Last refill: 09/21/22 #180 2RF  Notes to clinic: off protocol- provider review   Requested Prescriptions  Pending Prescriptions Disp Refills   AUVELITY 45-105 MG TBCR [Pharmacy Med Name: AUVELITY 45-105MG  TABLET ER] 180 tablet 2    Sig: TAKE 1 TABLET BY MOUTH TWICE A DAY     Off-Protocol Failed - 07/25/2023 11:10 AM      Failed - Medication not assigned to a protocol, review manually.      Passed - Valid encounter within last 12 months    Recent Outpatient Visits           2 months ago Healthcare maintenance   [place] [facility], Ocie Bob, MD   7 months ago Encounter for weight management   [place] [facility], Ocie Bob, MD   10 months ago Anxiety and depression   [place] [facility], Ocie Bob, MD   11 months ago Anxiety and depression   [place] [facility], Ocie Bob, MD   1 year ago Dermatitis   [facility] at [facility], Ocie Bob, MD       Future Appointments             In 1 month Ashley Royalty, Ocie Bob, MD [facility] Primary Care & Sports Medicine at [facility], [facility]               Requested Prescriptions  Pending Prescriptions Disp Refills   AUVELITY 45-105 MG TBCR [Pharmacy Med Name: AUVELITY 45-105MG  TABLET ER] 180 tablet 2    Sig: TAKE 1 TABLET BY MOUTH TWICE A DAY     Off-Protocol Failed - 07/25/2023 11:10 AM      Failed - Medication not assigned to a protocol, review manually.      Passed - Valid encounter within last 12 months    Recent Outpatient Visits           2 months ago Healthcare maintenance   [facility] Medicine at  [facility], Ocie Bob, MD   7 months ago Encounter for weight management   [place] [facility] at [facility], Ocie Bob, MD   10 months ago Anxiety and depression   [facility] Primary Care & [facility] Medicine at [facility], Ocie Bob, MD   11 months ago Anxiety and depression   [facility] at [facility], Ocie Bob, MD   1 year ago Dermatitis   [facility] Primary Care & Sports Medicine at [facility], Ocie Bob, MD       Future Appointments             In 1 month Ashley Royalty, Ocie Bob, MD [facility] Primary Care & Sports Medicine at [facility], [facility]

## 2023-08-14 ENCOUNTER — Encounter: Payer: Self-pay | Admitting: Obstetrics

## 2023-08-15 DIAGNOSIS — G4733 Obstructive sleep apnea (adult) (pediatric): Secondary | ICD-10-CM | POA: Diagnosis not present

## 2023-08-29 ENCOUNTER — Telehealth: Payer: Self-pay | Admitting: Obstetrics

## 2023-08-29 ENCOUNTER — Ambulatory Visit: Payer: BC Managed Care – PPO | Admitting: Obstetrics

## 2023-08-29 NOTE — Telephone Encounter (Signed)
Reached out to pt to reschedule appt that was scheduled on 12/26/20244 at 2:15 with [facility]. Left message for pt to call back to reschedule.

## 2023-08-30 ENCOUNTER — Ambulatory Visit (INDEPENDENT_AMBULATORY_CARE_PROVIDER_SITE_OTHER): Payer: BC Managed Care – PPO | Admitting: Family Medicine

## 2023-08-30 ENCOUNTER — Encounter: Payer: Self-pay | Admitting: Family Medicine

## 2023-08-30 VITALS — BP 100/72 | HR 75 | Ht 65.0 in | Wt 191.4 lb

## 2023-08-30 DIAGNOSIS — Z7689 Persons encountering health services in other specified circumstances: Secondary | ICD-10-CM

## 2023-08-30 DIAGNOSIS — F32A Depression, unspecified: Secondary | ICD-10-CM

## 2023-08-30 DIAGNOSIS — N939 Abnormal uterine and vaginal bleeding, unspecified: Secondary | ICD-10-CM | POA: Insufficient documentation

## 2023-08-30 DIAGNOSIS — F419 Anxiety disorder, unspecified: Secondary | ICD-10-CM

## 2023-08-30 MED ORDER — SERTRALINE HCL 100 MG PO TABS: 100.0000 mg | ORAL_TABLET | Freq: Every day | ORAL | 2 refills | Status: DC

## 2023-08-30 MED ORDER — NALTREXONE HCL (PAIN) 4.5 MG PO CAPS: ORAL_CAPSULE | ORAL | 1 refills | Status: DC

## 2023-08-30 NOTE — Assessment & Plan Note (Signed)
Lynn Zuniga, a patient with a history of ablation, presents with concerns about her menstrual cycle. She reports experiencing heavy bleeding with clots, similar to her pre-ablation symptoms. She also mentions severe cramping. [name] denies any current bleeding but expresses concern about the possibility of menopause, given her age and family history. She did reach out to her GYN, planning on scheduling a follow-up visit.  Menstrual Irregularities Recurrence of heavy menstrual bleeding with clots after previous ablation. No current bleeding. -Order pelvic ultrasound to assess endometrium, presence of fibroids, and ovarian health. -Advise patient to schedule follow-up appointment with OBGYN after ultrasound results are available.

## 2023-09-02 NOTE — Assessment & Plan Note (Addendum)
In addition to her menstrual concerns, Lynn Zuniga discusses her recent weight loss efforts. She has lost ten pounds over the past three months, which she attributes to a semaglutide injection she trialed. She expresses interest in trying a new medication to help curb cravings and further her weight loss  Weight Management Patient has lost 10 pounds over the past three months. Previously tried topiramate for weight loss but discontinued due to side effects. -Prescribe Naloxone to help curb cravings and assist with weight loss given that she is getting bupropion from her Auvelity. Increase as tolerated in weekly intervals Take once daily in the morning for 1 week Then 1 capsule twice daily for 1 week Then 2 capsules in the morning and 1 capsule in the evening for 1 week Then 2 capsules twice daily (maximum dose: 4 capsules/day)  -Continue monitoring weight and adjust treatment plan as necessary.

## 2023-09-02 NOTE — Patient Instructions (Addendum)
Plan for Managing Menstrual Irregularities, Weight Management, Mood Management, and Sleep Apnea:  - Menstrual Irregularities:   - Schedule a pelvic ultrasound.   - After receiving the ultrasound results, arrange a follow-up appointment with your OBGYN.  - Weight Management:   - We will prescribe Naloxone to help curb cravings and support further weight loss.  Take as follows:  Increase as tolerated in weekly intervals Take once daily in the morning for 1 week Then 1 capsule twice daily for 1 week Then 2 capsules in the morning and 1 capsule in the evening for 1 week Then 2 capsules twice daily (maximum dose: 4 capsules/day)     - Continue monitoring your weight, and we will adjust the treatment plan as needed.  - Mood Management:   - We will refill your Zoloft prescription.   - Continue taking Zoloft and Auvelity. Contact us if adjustments are needed.  Feel free to contact us with any questions or concerns.

## 2023-09-02 NOTE — Assessment & Plan Note (Signed)
Lynn Zuniga also discusses her mood and sleep patterns. She reports feeling good overall and sleeping well when using her CPAP machine. However, she mentions a recent disruption in her sleep schedule due to a vacation and the holiday season. Lynn Zuniga is currently taking Zoloft and Auvelity for her mood and sleep issues, respectively.  Mood Management Patient reports good mood. Currently on Zoloft and Auvelity. -Refill Zoloft prescription. -Continue monitoring mood and adjust treatment plan as necessary.

## 2023-09-02 NOTE — Progress Notes (Signed)
Primary Care / Sports Medicine Office Visit  Patient Information:  Patient ID: Lynn Zuniga, female DOB: 07-10-1986 Age: 37 y.o. MRN: [id number]   Lynn Zuniga is a pleasant 37 y.o. female presenting with the following:  Chief Complaint  Patient presents with   Depression    Patient here for a follow up on here depression and the medication she has been taking  (Auvelity and Zoloft) are working fine, she did not like the way the Topamax made her feel so she D/C taking that.She would like to keep taking the medications she is on now.     Vitals:   08/30/23 0959  BP: 100/72  Pulse: 75  SpO2: 97%   Vitals:   08/30/23 0959  Weight: 191 lb 6.4 oz (86.8 kg)  Height: 5\' 5"  (1.651 m)   Body mass index is 31.85 kg/m.  No results found.   Independent interpretation of notes and tests performed by another provider:   None  Procedures performed:   None  Pertinent History, Exam, Impression, and Recommendations:   Problem List Items Addressed This Visit       Other   Abnormal vaginal bleeding   Lynn Zuniga, a patient with a history of ablation, presents with concerns about her menstrual cycle. She reports experiencing heavy bleeding with clots, similar to her pre-ablation symptoms. She also mentions severe cramping. Lynn Zuniga denies any current bleeding but expresses concern about the possibility of menopause, given her age and family history. She did reach out to her GYN, planning on scheduling a follow-up visit.  Menstrual Irregularities Recurrence of heavy menstrual bleeding with clots after previous ablation. No current bleeding. -Order pelvic ultrasound to assess endometrium, presence of fibroids, and ovarian health. -Advise patient to schedule follow-up appointment with OBGYN after ultrasound results are available.      Relevant Orders   US Pelvic Complete With Transvaginal   Anxiety and depression - Primary   Lynn Zuniga also discusses her mood and sleep patterns.  She reports feeling good overall and sleeping well when using her CPAP machine. However, she mentions a recent disruption in her sleep schedule due to a vacation and the holiday season. [name] is currently taking Zoloft and Auvelity for her mood and sleep issues, respectively.  Mood Management Patient reports good mood. Currently on Zoloft and Auvelity. -Refill Zoloft prescription. -Continue monitoring mood and adjust treatment plan as necessary.      Relevant Medications   sertraline (ZOLOFT) 100 MG tablet   Encounter for weight management   In addition to her menstrual concerns, Lynn Zuniga discusses her recent weight loss efforts. She has lost ten pounds over the past three months, which she attributes to a semaglutide injection she trialed. She expresses interest in trying a new medication to help curb cravings and further her weight loss  Weight Management Patient has lost 10 pounds over the past three months. Previously tried topiramate for weight loss but discontinued due to side effects. -Prescribe Naloxone to help curb cravings and assist with weight loss given that she is getting bupropion from her Auvelity. -Continue monitoring weight and adjust treatment plan as necessary.      Relevant Medications   Naltrexone HCl, Pain, 4.5 MG CAPS     Orders & Medications Medications:  Meds ordered this encounter  Medications   sertraline (ZOLOFT) 100 MG tablet    Sig: Take 1 tablet (100 mg total) by mouth daily.    Dispense:  90 tablet    Refill:  2  Naltrexone HCl, Pain, 4.5 MG CAPS    Sig: One capsule (naltrexone 4.5 mg) once daily in the morning for 1 week; increase as tolerated in weekly intervals: 1 capsule twice daily for 1 week; then 2 capsules in the morning and 1 capsule in the evening for 1 week; and then 2 capsules twice daily (maximum dose: 4 capsules/day)    Dispense:  90 capsule    Refill:  1   Orders Placed This Encounter  Procedures   US Pelvic Complete With  Transvaginal     Return in about 3 months (around 11/28/2023).     Jerrol Banana, MD, [facility]   Primary Care Sports Medicine Primary Care and Sports Medicine at [facility]

## 2023-09-03 ENCOUNTER — Telehealth: Payer: Self-pay

## 2023-09-03 NOTE — Telephone Encounter (Signed)
 Patient medication Naltrexone not covered for "weight loss management."   Patient will need to pay out of pocket for medication if she wants this.  - Lynn Zuniga

## 2023-09-06 ENCOUNTER — Ambulatory Visit
Admission: RE | Admit: 2023-09-06 | Discharge: 2023-09-06 | Disposition: A | Discharge status: Home / Self Care | Payer: BC Managed Care – PPO | Source: Ambulatory Visit | Attending: Family Medicine | Admitting: Family Medicine

## 2023-09-06 DIAGNOSIS — N83291 Other ovarian cyst, right side: Secondary | ICD-10-CM | POA: Diagnosis not present

## 2023-09-06 DIAGNOSIS — N852 Hypertrophy of uterus: Secondary | ICD-10-CM | POA: Diagnosis not present

## 2023-09-06 DIAGNOSIS — D259 Leiomyoma of uterus, unspecified: Secondary | ICD-10-CM | POA: Diagnosis not present

## 2023-09-06 DIAGNOSIS — N939 Abnormal uterine and vaginal bleeding, unspecified: Secondary | ICD-10-CM | POA: Diagnosis not present

## 2023-09-06 DIAGNOSIS — N8301 Follicular cyst of right ovary: Secondary | ICD-10-CM | POA: Diagnosis not present

## 2023-09-10 ENCOUNTER — Encounter: Payer: Self-pay | Admitting: Family Medicine

## 2023-09-11 NOTE — Progress Notes (Signed)
 GYNECOLOGY PROGRESS NOTE   Subjective:    PCP: Alvia Selinda PARAS, MD Lynn Zuniga is a 38 y.o. female [phone] who presents for pelvic pain and HMB. Pt had an ablation in 2022, was amenorrheic and two months ago, her period came back. Last month it was very heavy with clots, she saw her PCP, who ordered a pelvic US , results are below, and he recommended she follow up with us . This month the bleeding is lighter, but she would like to discuss getting rid of her periods altogether.    GYN HISTORY:  Patient's last menstrual period was 09/11/2023.     Menstrual History: OB History     Gravida  3   Para  2   Term  2   Preterm      AB  1   Living  2      SAB      IAB  1   Ectopic      Multiple      Live Births              Menarche age: 38 Patient's last menstrual period was 09/11/2023.   Periods are every irregular days, and last 5 days, flow is heavy.  Uses tampons and changes it every 2-3 hours.  Cramping is moderate.  Cyclic symptoms include: pelvic pain.  Intermenstrual bleeding, spotting, or discharge? Now yes  Urinary incontinence? no  Sexually active: yes  Number of sexual partners: 1  Gender of sexual Partners: male  Social History   Substance and Sexual Activity  Sexual Activity Yes   Birth control/protection: Surgical   Contraceptive methods: bilateral tubal ligation in 2012 Dyspareunia? Sometimes after STI history: no STI/HIV testing or immunizations needed? No.   Social History   Tobacco Use   Smoking status: Former    Types: E-cigarettes   Smokeless tobacco: Never  Substance Use Topics   Alcohol use: Never    Comment: rarely   Occupation: [employer]     Lives with: self and kids    _______________________________________________  Current Outpatient Medications  Medication Sig Dispense Refill   Dextromethorphan-buPROPion ER (AUVELITY ) 45-105 MG TBCR TAKE 1 TABLET BY MOUTH TWICE A DAY 180 tablet 1   sertraline  (ZOLOFT ) 100 MG  tablet Take 1 tablet (100 mg total) by mouth daily. 90 tablet 2   Naltrexone  HCl, Pain, 4.5 MG CAPS One capsule (naltrexone  4.5 mg) once daily in the morning for 1 week; increase as tolerated in weekly intervals: 1 capsule twice daily for 1 week; then 2 capsules in the morning and 1 capsule in the evening for 1 week; and then 2 capsules twice daily (maximum dose: 4 capsules/day) (Patient not taking: Reported on 09/12/2023) 90 capsule 1   No current facility-administered medications for this visit.   No Known Allergies  Past Medical History:  Diagnosis Date   Anxiety    Depression    Menorrhagia with regular cycle 11/19/2016   very light to nonexistent monthly period flow   Sleep apnea    Past Surgical History:  Procedure Laterality Date   DENTAL RESTORATION/EXTRACTION WITH X-RAY     ENDOMETRIAL ABLATION  2022   TUBAL LIGATION  2012    Review Of Systems  Constitutional: Denied constitutional symptoms, night sweats, recent illness, fatigue, fever, insomnia and weight loss.  Eyes: Denied eye symptoms, eye pain, photophobia, vision change and visual disturbance.  Ears/Nose/Throat/Neck: Denied ear, nose, throat or neck symptoms, hearing loss, nasal discharge, sinus congestion and sore throat.  Cardiovascular: Denied cardiovascular symptoms, arrhythmia, chest pain/pressure, edema, exercise intolerance, orthopnea and palpitations.  Respiratory: Denied pulmonary symptoms, asthma, pleuritic pain, productive sputum, cough, dyspnea and wheezing.  Gastrointestinal: Denied, gastro-esophageal reflux, melena, nausea and vomiting.  Genitourinary: Heavy, painful periods after ablation  Musculoskeletal: Denied musculoskeletal symptoms, stiffness, swelling, muscle weakness and myalgia.  Dermatologic: Denied dermatology symptoms, rash and scar.  Neurologic: Denied neurology symptoms, dizziness, headache, neck pain and syncope.  Psychiatric: Denied psychiatric symptoms, anxiety and depression.  Endocrine:  Denied endocrine symptoms including hot flashes and night sweats.      Objective:    BP (!) 108/56   Pulse 79   Ht 5' 5 (1.651 m)   Wt 198 lb (89.8 kg)   LMP 09/11/2023   BMI 32.95 kg/m   Physical Exam Constitutional:      Appearance: Normal appearance. She is obese.  HENT:     Head: Normocephalic and atraumatic.  Eyes:     Extraocular Movements: Extraocular movements intact.  Pulmonary:     Effort: Pulmonary effort is normal.  Musculoskeletal:        General: Normal range of motion.     Cervical back: Normal range of motion.  Skin:    General: Skin is warm and dry.  Neurological:     General: No focal deficit present.     Mental Status: She is alert.  Psychiatric:        Mood and Affect: Mood normal.        Behavior: Behavior normal.    09/06/23 US  PELVIC COMPLETE FINDINGS: The uterus is anteverted in position and measures 11.1 x 4.6 x 4.9 cm. It demonstrates a heterogeneous echotexture with multiple fibroids, largest 1.9 cm and appears intramural within the anterior body. The endometrium measures 0.5 cm and demonstrates a trace amount of fluid.  Assessment/Plan:    Lynn Zuniga is a 38 y.o. female G3P2012, s/p endometrial ablation in 2022 with subsequent amenorrhea, now with HMB for the past two months and desiring amenorrhea. US  shows a fibroid uterus. Uterine fibroids were discussed in detail.  The natural course and history of fibroids were reviewed.  Multiple treatment options were discussed including expectant, NSAIDS, hormonal options, UAE and hysterectomy.  Menopause and its effect on fibroids was also reviewed. Patient would like to move forward with UAE. Referral placed. Follow up for annual when due, sooner for other concerns.    Lynn Mangle, DO [facility] at [facility]

## 2023-09-12 ENCOUNTER — Ambulatory Visit (INDEPENDENT_AMBULATORY_CARE_PROVIDER_SITE_OTHER): Payer: BC Managed Care – PPO | Admitting: Obstetrics

## 2023-09-12 ENCOUNTER — Encounter: Payer: Self-pay | Admitting: Obstetrics

## 2023-09-12 VITALS — BP 108/56 | HR 79 | Ht 65.0 in | Wt 198.0 lb

## 2023-09-12 DIAGNOSIS — D251 Intramural leiomyoma of uterus: Secondary | ICD-10-CM | POA: Diagnosis not present

## 2023-09-12 DIAGNOSIS — N852 Hypertrophy of uterus: Secondary | ICD-10-CM | POA: Diagnosis not present

## 2023-09-13 NOTE — Telephone Encounter (Signed)
 Pt was seen on 09/12/2023 with Dr. Lonny Prude.

## 2023-09-15 DIAGNOSIS — G4733 Obstructive sleep apnea (adult) (pediatric): Secondary | ICD-10-CM | POA: Diagnosis not present

## 2023-09-16 ENCOUNTER — Other Ambulatory Visit: Payer: Self-pay

## 2023-09-16 ENCOUNTER — Encounter: Payer: Self-pay | Admitting: Obstetrics

## 2023-09-16 ENCOUNTER — Other Ambulatory Visit: Payer: Self-pay | Admitting: Family Medicine

## 2023-09-16 DIAGNOSIS — Z7689 Persons encountering health services in other specified circumstances: Secondary | ICD-10-CM

## 2023-09-16 MED ORDER — SEMAGLUTIDE-WEIGHT MANAGEMENT 0.5 MG/0.5ML ~~LOC~~ SOAJ: 0.5000 mg | SUBCUTANEOUS | 0 refills | Status: DC

## 2023-09-16 MED ORDER — SEMAGLUTIDE-WEIGHT MANAGEMENT 0.25 MG/0.5ML ~~LOC~~ SOAJ: 0.2500 mg | SUBCUTANEOUS | 0 refills | Status: DC

## 2023-09-16 MED ORDER — SEMAGLUTIDE-WEIGHT MANAGEMENT 0.25 MG/0.5ML ~~LOC~~ SOAJ: 0.2500 mg | SUBCUTANEOUS | 0 refills | Status: AC

## 2023-09-16 MED ORDER — SEMAGLUTIDE-WEIGHT MANAGEMENT 1 MG/0.5ML ~~LOC~~ SOAJ: 1.0000 mg | SUBCUTANEOUS | 0 refills | Status: DC

## 2023-09-16 MED ORDER — SEMAGLUTIDE-WEIGHT MANAGEMENT 1 MG/0.5ML ~~LOC~~ SOAJ: 1.0000 mg | SUBCUTANEOUS | 0 refills | Status: AC

## 2023-10-09 ENCOUNTER — Encounter: Payer: Self-pay | Admitting: Obstetrics and Gynecology

## 2023-10-09 ENCOUNTER — Ambulatory Visit (INDEPENDENT_AMBULATORY_CARE_PROVIDER_SITE_OTHER): Payer: BC Managed Care – PPO | Admitting: Obstetrics and Gynecology

## 2023-10-09 VITALS — BP 104/67 | HR 66 | Ht 65.0 in | Wt 200.7 lb

## 2023-10-09 DIAGNOSIS — N852 Hypertrophy of uterus: Secondary | ICD-10-CM | POA: Diagnosis not present

## 2023-10-09 DIAGNOSIS — D251 Intramural leiomyoma of uterus: Secondary | ICD-10-CM

## 2023-10-09 DIAGNOSIS — N946 Dysmenorrhea, unspecified: Secondary | ICD-10-CM | POA: Diagnosis not present

## 2023-10-09 DIAGNOSIS — N83201 Unspecified ovarian cyst, right side: Secondary | ICD-10-CM | POA: Diagnosis not present

## 2023-10-09 NOTE — Progress Notes (Signed)
 HPI:      [name] is a 38 y.o. [id number] who LMP was Patient's last menstrual period was 10/07/2023 (approximate).  Subjective:   She presents today to discuss the possibility of hysterectomy.  She has a history of uterine fibroids severe dysmenorrhea and heavy bleeding.  She underwent an endometrial ablation that she is unsatisfied with.  She continues to experience monthly dysmenorrhea.  She has occasional monthly bleeding which is not as heavy as it used to be but is still present.  Last month she had a full-blown menstrual period. She would like to discuss definitive management for this dysmenorrhea which is disruptive to her. She has a tubal ligation and has thus completed childbearing.  She has a history of vaginal birth.    Hx: The following portions of the patient's history were reviewed and updated as appropriate:             She  has a past medical history of Anxiety, Depression, Menorrhagia with regular cycle (11/19/2016), and Sleep apnea. She does not have any pertinent problems on file. She  has a past surgical history that includes Tubal ligation (2012); Dental restoration/extraction with x-ray; and Endometrial ablation (2022). Her family history includes Brain cancer in her maternal aunt; Breast cancer in her paternal grandmother; Cancer in her maternal grandfather, maternal grandmother, and paternal grandmother; Lung cancer in her maternal aunt. She  reports that she has quit smoking. Her smoking use included e-cigarettes. She has never used smokeless tobacco. She reports that she does not drink alcohol and does not use drugs. She has a current medication list which includes the following prescription(s): auvelity , semaglutide -weight management, [START ON 10/15/2023] semaglutide -weight management, [START ON 11/13/2023] semaglutide -weight management, sertraline , and naltrexone  hcl (pain). She has no known allergies.       Review of Systems:  Review of  Systems  Constitutional: Denied constitutional symptoms, night sweats, recent illness, fatigue, fever, insomnia and weight loss.  Eyes: Denied eye symptoms, eye pain, photophobia, vision change and visual disturbance.  Ears/Nose/Throat/Neck: Denied ear, nose, throat or neck symptoms, hearing loss, nasal discharge, sinus congestion and sore throat.  Cardiovascular: Denied cardiovascular symptoms, arrhythmia, chest pain/pressure, edema, exercise intolerance, orthopnea and palpitations.  Respiratory: Denied pulmonary symptoms, asthma, pleuritic pain, productive sputum, cough, dyspnea and wheezing.  Gastrointestinal: Denied, gastro-esophageal reflux, melena, nausea and vomiting.  Genitourinary: See HPI for additional information.  Musculoskeletal: Denied musculoskeletal symptoms, stiffness, swelling, muscle weakness and myalgia.  Dermatologic: Denied dermatology symptoms, rash and scar.  Neurologic: Denied neurology symptoms, dizziness, headache, neck pain and syncope.  Psychiatric: Denied psychiatric symptoms, anxiety and depression.  Endocrine: Denied endocrine symptoms including hot flashes and night sweats.   Meds:   Current Outpatient Medications on File Prior to Visit  Medication Sig Dispense Refill   Dextromethorphan-buPROPion ER (AUVELITY ) 45-105 MG TBCR TAKE 1 TABLET BY MOUTH TWICE A DAY 180 tablet 1   Semaglutide -Weight Management 0.25 MG/0.5ML SOAJ Inject 0.25 mg into the skin once a week for 28 days. 2 mL 0   [START ON 10/15/2023] Semaglutide -Weight Management 0.5 MG/0.5ML SOAJ Inject 0.5 mg into the skin once a week for 28 days. 2 mL 0   [START ON 11/13/2023] Semaglutide -Weight Management 1 MG/0.5ML SOAJ Inject 1 mg into the skin once a week for 28 days. 2 mL 0   sertraline  (ZOLOFT ) 100 MG tablet Take 1 tablet (100 mg total) by mouth daily. 90 tablet 2   Naltrexone  HCl, Pain, 4.5 MG CAPS One capsule (naltrexone  4.5 mg) once  daily in the morning for 1 week; increase as tolerated in  weekly intervals: 1 capsule twice daily for 1 week; then 2 capsules in the morning and 1 capsule in the evening for 1 week; and then 2 capsules twice daily (maximum dose: 4 capsules/day) (Patient not taking: Reported on 10/09/2023) 90 capsule 1   No current facility-administered medications on file prior to visit.      Objective:     Vitals:   10/09/23 1055  BP: 104/67  Pulse: 66   Filed Weights   10/09/23 1055  Weight: 200 lb 11.2 oz (91 kg)              Ultrasound results reviewed revealing multiple uterine fibroids and a small ovarian cyst.          Assessment:    [id number] Patient Active Problem List   Diagnosis Date Noted   Intramural leiomyoma of uterus 09/12/2023   Enlarged uterus 09/12/2023   Abnormal vaginal bleeding 08/30/2023   Encounter for weight management 09/21/2022   Annual physical exam 05/11/2022   OSA on CPAP 02/08/2022   Chronic fatigue 01/11/2022   Anxiety and depression 01/11/2022   Anemia 01/11/2022   S/P endometrial ablation 01/16/2021   CIN I (cervical intraepithelial neoplasia I) 11/22/2020   Cervical dysplasia 11/19/2016     1. Intramural leiomyoma of uterus   2. Right ovarian cyst   3. Enlarged uterus   4. Dysmenorrhea        Plan:            1.  We have discussed pain relief management for her significant dysmenorrhea.  She has tried many of these things without success.  As she has had an endometrial ablation and that has failed to help with her dysmenorrhea and she continues to have bleeding this has been unsatisfactory for her. She has strongly expressed her desire for hysterectomy. We have discussed scheduling and she will come back for a preop appointment when she desires surgery.  All of her questions were answered. Orders No orders of the defined types were placed in this encounter.   No orders of the defined types were placed in this encounter.     F/U  Return in about 2 weeks (around 10/23/2023).  Alm DOROTHA Sar,  M.D. 10/09/2023 11:42 AM

## 2023-10-09 NOTE — Progress Notes (Signed)
 Patient presents today to discuss a hysterectomy. She states with her history of a failed ablation, ongoing pelvic pain and irregular bleeding she would like a hysterectomy. Recent ultrasound performed showing a 1.9 cm intramural fibroid and 3.1 cm right ovarian cyst. No additional concerns.

## 2023-10-14 DIAGNOSIS — G4733 Obstructive sleep apnea (adult) (pediatric): Secondary | ICD-10-CM | POA: Diagnosis not present

## 2023-10-18 ENCOUNTER — Other Ambulatory Visit: Payer: Self-pay | Admitting: Family Medicine

## 2023-10-18 DIAGNOSIS — Z7689 Persons encountering health services in other specified circumstances: Secondary | ICD-10-CM

## 2023-10-18 MED ORDER — SEMAGLUTIDE-WEIGHT MANAGEMENT 0.5 MG/0.5ML ~~LOC~~ SOAJ: 0.5000 mg | SUBCUTANEOUS | 0 refills | Status: AC

## 2023-10-21 ENCOUNTER — Encounter: Payer: Self-pay | Admitting: Family Medicine

## 2023-10-21 ENCOUNTER — Ambulatory Visit (INDEPENDENT_AMBULATORY_CARE_PROVIDER_SITE_OTHER): Payer: BC Managed Care – PPO | Admitting: Family Medicine

## 2023-10-21 VITALS — BP 110/64 | HR 78 | Resp 16 | Ht 65.0 in | Wt 198.0 lb

## 2023-10-21 DIAGNOSIS — L239 Allergic contact dermatitis, unspecified cause: Secondary | ICD-10-CM | POA: Diagnosis not present

## 2023-10-21 DIAGNOSIS — B354 Tinea corporis: Secondary | ICD-10-CM | POA: Diagnosis not present

## 2023-10-21 MED ORDER — METHYLPREDNISOLONE 4 MG PO TBPK: ORAL_TABLET | ORAL | 0 refills | Status: DC

## 2023-10-21 MED ORDER — HYDROXYZINE PAMOATE 25 MG PO CAPS
25.0000 mg | ORAL_CAPSULE | Freq: Three times a day (TID) | ORAL | 0 refills | Status: DC | PRN
Start: 2023-10-21 — End: 2023-11-12

## 2023-10-21 MED ORDER — CLOTRIMAZOLE-BETAMETHASONE 1-0.05 % EX CREA: 1.0000 | TOPICAL_CREAM | Freq: Two times a day (BID) | CUTANEOUS | 1 refills | Status: DC

## 2023-10-21 NOTE — Progress Notes (Signed)
 Primary Care / Sports Medicine Office Visit  Patient Information:  Patient ID: TAMSEN REIST, female DOB: 1986-06-25 Age: 38 y.o. MRN: [id number]   Yaquelin Langelier Hagenow is a pleasant 38 y.o. female presenting with the following:  Chief Complaint  Patient presents with   Rash    Under breasts and belly button since last week started with itching then rash.   Headache    Headache since yesterday.     Vitals:   10/21/23 1612  BP: 110/64  Pulse: 78  Resp: 16   Vitals:   10/21/23 1612  Weight: 198 lb (89.8 kg)  Height: 5\' 5"  (1.651 m)   Body mass index is 32.95 kg/m.  No results found.   Independent interpretation of notes and tests performed by another provider:   None  Procedures performed:   None  Pertinent History, Exam, Impression, and Recommendations:   Problem List Items Addressed This Visit     Allergic dermatitis   She also has a headache that started yesterday, located at the frontal region, accompanied by itchy and watery eyes. The headache is severe enough to prevent her from working, leading her to sleep the entire day. There have been no new exposures at work or changes in her environment. She notes minimal tenderness in the anterior cervical chain lymph nodes.  Physical Exam HEENT: Nasal mucosa erythematous, more pronounced in the right nostril. Ears normal. NECK: Left posterior cervical lymphadenopathy with minimal tenderness.  Allergic Reaction Itchy and watery eyes, headache, and redness in nostrils. Possible systemic allergic response. -Prescribe short course of oral steroids (Medrol Dosepak) to reduce inflammation and allergic response. -Recommend over-the-counter antihistamines (Claritin, Zyrtec, or Allegra) to be started the day after finishing the steroids. -Recommend Flonase for nasal inflammation and drainage. -Recommend allergy eye drops if eye symptoms are severe.      Tinea corporis - Primary   She has a rash that began last week,  initially presenting as itching around the umbilicus and inframammary regions. The rash has improved somewhat but continues to itch, particularly at night. There have been no recent changes in laundry detergent, soaps, shampoos, conditioners, lotions, or clothing, and she does not have any pets at home. The rash is described as red and itchy, located in areas where moisture tends to accumulate. She has a history of a previous rash on the back of her neck for which she was treated with oral steroids.  Exam demonstrates inframammary and lower umbilical erythematous rash consistent with tinea  Tinea Corporis Rash with itching and redness in areas of moisture and heat (under breasts, around belly button). Likely due to fungal proliferation. -Prescribe antifungal cream with steroid to be applied twice daily for 1-2 weeks. -Prescribe anti-itch pill (hydroxyzine 25 mg) to be taken as needed, especially at night if itching is severe.       Relevant Medications   clotrimazole-betamethasone (LOTRISONE) cream     Orders & Medications Medications:  Meds ordered this encounter  Medications   methylPREDNISolone (MEDROL DOSEPAK) 4 MG TBPK tablet    Sig: Take for full course per package instructions    Dispense:  21 tablet    Refill:  0   hydrOXYzine (VISTARIL) 25 MG capsule    Sig: Take 1 capsule (25 mg total) by mouth every 8 (eight) hours as needed for itching.    Dispense:  30 capsule    Refill:  0   clotrimazole-betamethasone (LOTRISONE) cream    Sig: Apply 1 Application topically  2 (two) times daily. Do not use more than 2 weeks.    Dispense:  45 g    Refill:  1   No orders of the defined types were placed in this encounter.    Return in about 3 months (around 01/18/2024) for Weight MGMT f/u.     Jerrol Banana, MD, [facility]   Primary Care Sports Medicine Primary Care and Sports Medicine at [facility]

## 2023-10-21 NOTE — Assessment & Plan Note (Signed)
 She has a rash that began last week, initially presenting as itching around the umbilicus and inframammary regions. The rash has improved somewhat but continues to itch, particularly at night. There have been no recent changes in laundry detergent, soaps, shampoos, conditioners, lotions, or clothing, and she does not have any pets at home. The rash is described as red and itchy, located in areas where moisture tends to accumulate. She has a history of a previous rash on the back of her neck for which she was treated with oral steroids.  Exam demonstrates inframammary and lower umbilical erythematous rash consistent with tinea  Tinea Corporis Rash with itching and redness in areas of moisture and heat (under breasts, around belly button). Likely due to fungal proliferation. -Prescribe antifungal cream with steroid to be applied twice daily for 1-2 weeks. -Prescribe anti-itch pill (hydroxyzine 25 mg) to be taken as needed, especially at night if itching is severe.

## 2023-10-21 NOTE — Assessment & Plan Note (Signed)
 She also has a headache that started yesterday, located at the frontal region, accompanied by itchy and watery eyes. The headache is severe enough to prevent her from working, leading her to sleep the entire day. There have been no new exposures at work or changes in her environment. She notes minimal tenderness in the anterior cervical chain lymph nodes.  Physical Exam HEENT: Nasal mucosa erythematous, more pronounced in the right nostril. Ears normal. NECK: Left posterior cervical lymphadenopathy with minimal tenderness.  Allergic Reaction Itchy and watery eyes, headache, and redness in nostrils. Possible systemic allergic response. -Prescribe short course of oral steroids (Medrol Dosepak) to reduce inflammation and allergic response. -Recommend over-the-counter antihistamines (Claritin, Zyrtec, or Allegra) to be started the day after finishing the steroids. -Recommend Flonase for nasal inflammation and drainage. -Recommend allergy eye drops if eye symptoms are severe.

## 2023-10-21 NOTE — Telephone Encounter (Signed)
 Please call pt to schedule an appt.  KP

## 2023-10-21 NOTE — Patient Instructions (Addendum)
 Plan  Tinea Corporis (Fungal Rash) - Use the prescribed antifungal cream with a steroid twice daily for 1-2 weeks to help clear up the rash. - Take the anti-itch pill (hydroxyzine 25 mg) as needed, especially at night, to help relieve severe itching.  Allergic Reaction - Take the prescribed Medrol Dosepak to reduce inflammation and manage your allergic symptoms. - After finishing the steroids, start taking an over-the-counter antihistamine like Claritin, Zyrtec, or Allegra to maintain allergy control. - Use Flonase to help with nasal inflammation and drainage. - If your eyes are very itchy or watery, consider using allergy eye drops for relief.

## 2023-10-23 ENCOUNTER — Encounter: Payer: Self-pay | Admitting: Family Medicine

## 2023-10-23 NOTE — Telephone Encounter (Signed)
 Please review  Thank you

## 2023-10-24 ENCOUNTER — Encounter: Payer: Self-pay | Admitting: Family Medicine

## 2023-10-24 ENCOUNTER — Ambulatory Visit: Payer: BC Managed Care – PPO | Admitting: Obstetrics and Gynecology

## 2023-11-07 ENCOUNTER — Encounter: Payer: Self-pay | Admitting: Obstetrics and Gynecology

## 2023-11-07 ENCOUNTER — Ambulatory Visit (INDEPENDENT_AMBULATORY_CARE_PROVIDER_SITE_OTHER): Payer: BC Managed Care – PPO | Admitting: Obstetrics and Gynecology

## 2023-11-07 VITALS — BP 111/73 | HR 63 | Ht 65.0 in | Wt 197.0 lb

## 2023-11-07 DIAGNOSIS — D251 Intramural leiomyoma of uterus: Secondary | ICD-10-CM

## 2023-11-07 DIAGNOSIS — N83201 Unspecified ovarian cyst, right side: Secondary | ICD-10-CM

## 2023-11-07 DIAGNOSIS — N852 Hypertrophy of uterus: Secondary | ICD-10-CM

## 2023-11-07 DIAGNOSIS — Z01818 Encounter for other preprocedural examination: Secondary | ICD-10-CM | POA: Diagnosis not present

## 2023-11-07 DIAGNOSIS — N946 Dysmenorrhea, unspecified: Secondary | ICD-10-CM

## 2023-11-07 MED ORDER — METRONIDAZOLE 500 MG PO TABS: 500.0000 mg | ORAL_TABLET | Freq: Two times a day (BID) | ORAL | 0 refills | Status: DC

## 2023-11-07 NOTE — Progress Notes (Signed)
 Wear to the anterior lip, and      PRE-OPERATIVE HISTORY AND PHYSICAL EXAM  PCP:  Jerrol Banana, MD Subjective:   HPI:  Lynn Zuniga is a 38 y.o. [id number].  Patient's last menstrual period was 10/07/2023 (approximate).  She presents today for a pre-op discussion and PE.  She has the following symptoms: Continued pelvic pain/dysmenorrhea failed ablation  Review of Systems:   Constitutional: Denied constitutional symptoms, night sweats, recent illness, fatigue, fever, insomnia and weight loss.  Eyes: Denied eye symptoms, eye pain, photophobia, vision change and visual disturbance.  Ears/Nose/Throat/Neck: Denied ear, nose, throat or neck symptoms, hearing loss, nasal discharge, sinus congestion and sore throat.  Cardiovascular: Denied cardiovascular symptoms, arrhythmia, chest pain/pressure, edema, exercise intolerance, orthopnea and palpitations.  Respiratory: Denied pulmonary symptoms, asthma, pleuritic pain, productive sputum, cough, dyspnea and wheezing.  Gastrointestinal: Denied, gastro-esophageal reflux, melena, nausea and vomiting.  Genitourinary: See HPI for additional information.  Musculoskeletal: Denied musculoskeletal symptoms, stiffness, swelling, muscle weakness and myalgia.  Dermatologic: Denied dermatology symptoms, rash and scar.  Neurologic: Denied neurology symptoms, dizziness, headache, neck pain and syncope.  Psychiatric: Denied psychiatric symptoms, anxiety and depression.  Endocrine: Denied endocrine symptoms including hot flashes and night sweats.   OB History  Gravida Para Term Preterm AB Living  3 2 2  1 2   SAB IAB Ectopic Multiple Live Births   1       # Outcome Date GA Lbr Len/2nd Weight Sex Type Anes PTL Lv  3 IAB           2 Term      Vag-Spont     1 Term      Vag-Spont       Past Medical History:  Diagnosis Date   Anxiety    Depression    Menorrhagia with regular cycle 11/19/2016   very light to nonexistent monthly period flow   Sleep  apnea     Past Surgical History:  Procedure Laterality Date   DENTAL RESTORATION/EXTRACTION WITH X-RAY     ENDOMETRIAL ABLATION  2022   TUBAL LIGATION  2012      SOCIAL HISTORY:  Social History   Tobacco Use  Smoking Status Former   Types: E-cigarettes  Smokeless Tobacco Never   Social History   Substance and Sexual Activity  Alcohol Use Never   Comment: rarely    Social History   Substance and Sexual Activity  Drug Use Never    Family History  Problem Relation Age of Onset   Lung cancer Maternal Aunt    Brain cancer Maternal Aunt    Cancer Maternal Grandmother    Cancer Maternal Grandfather    Breast cancer Paternal Grandmother    Cancer Paternal Grandmother     ALLERGIES:  Patient has no known allergies.  MEDS:   Current Outpatient Medications on File Prior to Visit  Medication Sig Dispense Refill   clotrimazole-betamethasone (LOTRISONE) cream Apply 1 Application topically 2 (two) times daily. Do not use more than 2 weeks. 45 g 1   Dextromethorphan-buPROPion ER (AUVELITY) 45-105 MG TBCR TAKE 1 TABLET BY MOUTH TWICE A DAY 180 tablet 1   hydrOXYzine (VISTARIL) 25 MG capsule Take 1 capsule (25 mg total) by mouth every 8 (eight) hours as needed for itching. 30 capsule 0   methylPREDNISolone (MEDROL DOSEPAK) 4 MG TBPK tablet Take for full course per package instructions 21 tablet 0   Semaglutide-Weight Management 0.5 MG/0.5ML SOAJ Inject 0.5 mg into the skin once a week for  28 days. 2 mL 0   [START ON 11/13/2023] Semaglutide-Weight Management 1 MG/0.5ML SOAJ Inject 1 mg into the skin once a week for 28 days. 2 mL 0   sertraline (ZOLOFT) 100 MG tablet Take 1 tablet (100 mg total) by mouth daily. 90 tablet 2   No current facility-administered medications on file prior to visit.    No orders of the defined types were placed in this encounter.    Physical examination BP 111/73   Pulse 63   Ht 5\' 5"  (1.651 m)   Wt 197 lb (89.4 kg)   LMP 10/07/2023  (Approximate)   BMI 32.78 kg/m   General NAD, Conversant  HEENT Atraumatic; Op clear with mmm.  Normo-cephalic.  Anicteric sclerae  Thyroid/Neck Smooth without nodularity or enlargement. Normal ROM.  Neck Supple.  Skin No rashes, lesions or ulceration. Normal palpated skin turgor. No nodularity.  Breasts: No masses or discharge.  Symmetric.  No axillary adenopathy.  Lungs: Clear to auscultation.No rales or wheezes. Normal Respiratory effort, no retractions.  Heart: NSR.  No murmurs or rubs appreciated. No peripheral edema  Abdomen: Soft.  Non-tender.  No masses.  No HSM. No hernia  Extremities: Moves all appropriately.  Normal ROM for age. No lymphadenopathy.  Neuro: Oriented to PPT.  Normal mood. Normal affect.     Pelvic:   Vulva: Normal appearance.  No lesions.  Vagina: No lesions or abnormalities noted.  Support: Normal pelvic support.  Urethra No masses tenderness or scarring.  Meatus Normal size without lesions or prolapse.  Cervix: Normal ectropion.  No lesions.  Anus: Normal exam.  No lesions.  Perineum: Normal exam.  No lesions.        Bimanual   Uterus: Normal size.  Non-tender.  Mobile.  AV.  Adnexae: No masses.  Non-tender to palpation.  Cul-de-sac: Negative for abnormality.   Assessment:   [id number] Patient Active Problem List   Diagnosis Date Noted   Tinea corporis 10/21/2023   Allergic dermatitis 10/21/2023   Intramural leiomyoma of uterus 09/12/2023   Enlarged uterus 09/12/2023   Abnormal vaginal bleeding 08/30/2023   Encounter for weight management 09/21/2022   Annual physical exam 05/11/2022   OSA on CPAP 02/08/2022   Chronic fatigue 01/11/2022   Anxiety and depression 01/11/2022   Anemia 01/11/2022   S/P endometrial ablation 01/16/2021   CIN I (cervical intraepithelial neoplasia I) 11/22/2020   Cervical dysplasia 11/19/2016    1. Pre-op exam   2. Intramural leiomyoma of uterus   3. Right ovarian cyst   4. Dysmenorrhea   5. Enlarged uterus       Plan:   Orders: No orders of the defined types were placed in this encounter.    1.  LAVH bilateral salpingectomy  Pre-op discussions regarding Risks and Benefits of her scheduled surgery.  LAVH The procedure of Laparoscopic Assisted Vaginal Hysterectomy was described to the patient in detail.  We reviewed the rationale for Hysterectomy and the patient was again informed of other nonsurgical management possibilities for her condition.  She has considered these other options, and desires a Hysterectomy.  We have reviewed the fact that Hysterectomy is permanent and that following the procedure she will not be able to become pregnant or bear children.  We have discussed the following risk factors specifically and the patient has also been informed that additional complications not mentioned may develop:  Damage to bowel, bladder, ureters or to other internal organs, bleeding, infection and the risk from anesthesia.  We have  discussed the procedure itself in detail and she has an informed understanding of this surgery.  We have also discussed the recovery period in which physical and sexual activity will be restricted for a varying degree of time, often 3 - 6 weeks. The Laparoscopic Portion of Hysterectomy has also been reviewed with the patient.  She understands how the laparoscope facilitates the procedure.  We have discussed the abdominal incisions and punctures that will be used.  We have also reviewed the increased Operating Room time often accompanying LAVH.  The slightly increased risk of complications secondary to abdominal punctures, and use of laparoscopic instrumentation has also been discussed in detail.I have answered all of her questions and I believe the patient has an informed understanding of the procedure of Laparoscopic Assisted Vaginal Hysterectomy. Oophorectomy The option of Oophorectomy has been discussed with the patient.  Detailed risk/benefits have been reviewed.  The risks  discussed include, but are not limited to, hemorrhage, infection, damage to ureter or other internal organ, and Ovarian Remnant Syndrome.  The benefits include a significant decrease in the risk of Ovarian Cancer and in benign Ovarian disease.  The risk of Ovarian CA has been estimated at 1 in 70.  This is a relatively small risk.  However, should Ovarian CA develop, it is often found late in the course of the disease.  We have also discussed the role of inheritance in the development of Ovarian disease.  Some women, who have close relatives with Ovarian CA, have a higher than 1 in 70 risk of Ovarian CA.  The benefits of Estrogen replacement therapy following Oophorectomy has been stressed.  If she is premenopausal, we have discussed the fact that this procedure will make her permanently sterile and that premature menopause will result if no ERT is begun.  I have answered all of her questions, and I believe that she has an adequate and informed understanding of the risks and benefits of Oophorectomy. She has elected to keep her ovaries.  Elonda Husky, M.D. 11/07/2023 8:46 AM

## 2023-11-07 NOTE — Progress Notes (Signed)
Patient presents today for a pre-op exam prior to hysterectomy. She states no additional concerns today.

## 2023-11-07 NOTE — H&P (Signed)
 Wear to the anterior lip, and      PRE-OPERATIVE HISTORY AND PHYSICAL EXAM  PCP:  Jerrol Banana, MD Subjective:   HPI:  Lynn Zuniga is a 38 y.o. [id number].  Patient's last menstrual period was 10/07/2023 (approximate).  She presents today for a pre-op discussion and PE.  She has the following symptoms: Continued pelvic pain/dysmenorrhea failed ablation  Review of Systems:   Constitutional: Denied constitutional symptoms, night sweats, recent illness, fatigue, fever, insomnia and weight loss.  Eyes: Denied eye symptoms, eye pain, photophobia, vision change and visual disturbance.  Ears/Nose/Throat/Neck: Denied ear, nose, throat or neck symptoms, hearing loss, nasal discharge, sinus congestion and sore throat.  Cardiovascular: Denied cardiovascular symptoms, arrhythmia, chest pain/pressure, edema, exercise intolerance, orthopnea and palpitations.  Respiratory: Denied pulmonary symptoms, asthma, pleuritic pain, productive sputum, cough, dyspnea and wheezing.  Gastrointestinal: Denied, gastro-esophageal reflux, melena, nausea and vomiting.  Genitourinary: See HPI for additional information.  Musculoskeletal: Denied musculoskeletal symptoms, stiffness, swelling, muscle weakness and myalgia.  Dermatologic: Denied dermatology symptoms, rash and scar.  Neurologic: Denied neurology symptoms, dizziness, headache, neck pain and syncope.  Psychiatric: Denied psychiatric symptoms, anxiety and depression.  Endocrine: Denied endocrine symptoms including hot flashes and night sweats.   OB History  Gravida Para Term Preterm AB Living  3 2 2  1 2   SAB IAB Ectopic Multiple Live Births   1       # Outcome Date GA Lbr Len/2nd Weight Sex Type Anes PTL Lv  3 IAB           2 Term      Vag-Spont     1 Term      Vag-Spont       Past Medical History:  Diagnosis Date   Anxiety    Depression    Menorrhagia with regular cycle 11/19/2016   very light to nonexistent monthly period flow   Sleep  apnea     Past Surgical History:  Procedure Laterality Date   DENTAL RESTORATION/EXTRACTION WITH X-RAY     ENDOMETRIAL ABLATION  2022   TUBAL LIGATION  2012      SOCIAL HISTORY:  Social History   Tobacco Use  Smoking Status Former   Types: E-cigarettes  Smokeless Tobacco Never   Social History   Substance and Sexual Activity  Alcohol Use Never   Comment: rarely    Social History   Substance and Sexual Activity  Drug Use Never    Family History  Problem Relation Age of Onset   Lung cancer Maternal Aunt    Brain cancer Maternal Aunt    Cancer Maternal Grandmother    Cancer Maternal Grandfather    Breast cancer Paternal Grandmother    Cancer Paternal Grandmother     ALLERGIES:  Patient has no known allergies.  MEDS:   Current Outpatient Medications on File Prior to Visit  Medication Sig Dispense Refill   clotrimazole-betamethasone (LOTRISONE) cream Apply 1 Application topically 2 (two) times daily. Do not use more than 2 weeks. 45 g 1   Dextromethorphan-buPROPion ER (AUVELITY) 45-105 MG TBCR TAKE 1 TABLET BY MOUTH TWICE A DAY 180 tablet 1   hydrOXYzine (VISTARIL) 25 MG capsule Take 1 capsule (25 mg total) by mouth every 8 (eight) hours as needed for itching. 30 capsule 0   methylPREDNISolone (MEDROL DOSEPAK) 4 MG TBPK tablet Take for full course per package instructions 21 tablet 0   Semaglutide-Weight Management 0.5 MG/0.5ML SOAJ Inject 0.5 mg into the skin once a week for  28 days. 2 mL 0   [START ON 11/13/2023] Semaglutide-Weight Management 1 MG/0.5ML SOAJ Inject 1 mg into the skin once a week for 28 days. 2 mL 0   sertraline (ZOLOFT) 100 MG tablet Take 1 tablet (100 mg total) by mouth daily. 90 tablet 2   No current facility-administered medications on file prior to visit.    No orders of the defined types were placed in this encounter.    Physical examination BP 111/73   Pulse 63   Ht 5\' 5"  (1.651 m)   Wt 197 lb (89.4 kg)   LMP 10/07/2023  (Approximate)   BMI 32.78 kg/m   General NAD, Conversant  HEENT Atraumatic; Op clear with mmm.  Normo-cephalic.  Anicteric sclerae  Thyroid/Neck Smooth without nodularity or enlargement. Normal ROM.  Neck Supple.  Skin No rashes, lesions or ulceration. Normal palpated skin turgor. No nodularity.  Breasts: No masses or discharge.  Symmetric.  No axillary adenopathy.  Lungs: Clear to auscultation.No rales or wheezes. Normal Respiratory effort, no retractions.  Heart: NSR.  No murmurs or rubs appreciated. No peripheral edema  Abdomen: Soft.  Non-tender.  No masses.  No HSM. No hernia  Extremities: Moves all appropriately.  Normal ROM for age. No lymphadenopathy.  Neuro: Oriented to PPT.  Normal mood. Normal affect.     Pelvic:   Vulva: Normal appearance.  No lesions.  Vagina: No lesions or abnormalities noted.  Support: Normal pelvic support.  Urethra No masses tenderness or scarring.  Meatus Normal size without lesions or prolapse.  Cervix: Normal ectropion.  No lesions.  Anus: Normal exam.  No lesions.  Perineum: Normal exam.  No lesions.        Bimanual   Uterus: Normal size.  Non-tender.  Mobile.  AV.  Adnexae: No masses.  Non-tender to palpation.  Cul-de-sac: Negative for abnormality.   Assessment:   [id number] Patient Active Problem List   Diagnosis Date Noted   Tinea corporis 10/21/2023   Allergic dermatitis 10/21/2023   Intramural leiomyoma of uterus 09/12/2023   Enlarged uterus 09/12/2023   Abnormal vaginal bleeding 08/30/2023   Encounter for weight management 09/21/2022   Annual physical exam 05/11/2022   OSA on CPAP 02/08/2022   Chronic fatigue 01/11/2022   Anxiety and depression 01/11/2022   Anemia 01/11/2022   S/P endometrial ablation 01/16/2021   CIN I (cervical intraepithelial neoplasia I) 11/22/2020   Cervical dysplasia 11/19/2016    1. Pre-op exam   2. Intramural leiomyoma of uterus   3. Right ovarian cyst   4. Dysmenorrhea   5. Enlarged uterus       Plan:   Orders: No orders of the defined types were placed in this encounter.    1.  LAVH bilateral salpingectomy

## 2023-11-07 NOTE — H&P (View-Only) (Signed)
 Wear to the anterior lip, and      PRE-OPERATIVE HISTORY AND PHYSICAL EXAM  PCP:  Jerrol Banana, MD Subjective:   HPI:  Lynn Zuniga is a 38 y.o. [id number].  Patient's last menstrual period was 10/07/2023 (approximate).  She presents today for a pre-op discussion and PE.  She has the following symptoms: Continued pelvic pain/dysmenorrhea failed ablation  Review of Systems:   Constitutional: Denied constitutional symptoms, night sweats, recent illness, fatigue, fever, insomnia and weight loss.  Eyes: Denied eye symptoms, eye pain, photophobia, vision change and visual disturbance.  Ears/Nose/Throat/Neck: Denied ear, nose, throat or neck symptoms, hearing loss, nasal discharge, sinus congestion and sore throat.  Cardiovascular: Denied cardiovascular symptoms, arrhythmia, chest pain/pressure, edema, exercise intolerance, orthopnea and palpitations.  Respiratory: Denied pulmonary symptoms, asthma, pleuritic pain, productive sputum, cough, dyspnea and wheezing.  Gastrointestinal: Denied, gastro-esophageal reflux, melena, nausea and vomiting.  Genitourinary: See HPI for additional information.  Musculoskeletal: Denied musculoskeletal symptoms, stiffness, swelling, muscle weakness and myalgia.  Dermatologic: Denied dermatology symptoms, rash and scar.  Neurologic: Denied neurology symptoms, dizziness, headache, neck pain and syncope.  Psychiatric: Denied psychiatric symptoms, anxiety and depression.  Endocrine: Denied endocrine symptoms including hot flashes and night sweats.   OB History  Gravida Para Term Preterm AB Living  3 2 2  1 2   SAB IAB Ectopic Multiple Live Births   1       # Outcome Date GA Lbr Len/2nd Weight Sex Type Anes PTL Lv  3 IAB           2 Term      Vag-Spont     1 Term      Vag-Spont       Past Medical History:  Diagnosis Date   Anxiety    Depression    Menorrhagia with regular cycle 11/19/2016   very light to nonexistent monthly period flow   Sleep  apnea     Past Surgical History:  Procedure Laterality Date   DENTAL RESTORATION/EXTRACTION WITH X-RAY     ENDOMETRIAL ABLATION  2022   TUBAL LIGATION  2012      SOCIAL HISTORY:  Social History   Tobacco Use  Smoking Status Former   Types: E-cigarettes  Smokeless Tobacco Never   Social History   Substance and Sexual Activity  Alcohol Use Never   Comment: rarely    Social History   Substance and Sexual Activity  Drug Use Never    Family History  Problem Relation Age of Onset   Lung cancer Maternal Aunt    Brain cancer Maternal Aunt    Cancer Maternal Grandmother    Cancer Maternal Grandfather    Breast cancer Paternal Grandmother    Cancer Paternal Grandmother     ALLERGIES:  Patient has no known allergies.  MEDS:   Current Outpatient Medications on File Prior to Visit  Medication Sig Dispense Refill   clotrimazole-betamethasone (LOTRISONE) cream Apply 1 Application topically 2 (two) times daily. Do not use more than 2 weeks. 45 g 1   Dextromethorphan-buPROPion ER (AUVELITY) 45-105 MG TBCR TAKE 1 TABLET BY MOUTH TWICE A DAY 180 tablet 1   hydrOXYzine (VISTARIL) 25 MG capsule Take 1 capsule (25 mg total) by mouth every 8 (eight) hours as needed for itching. 30 capsule 0   methylPREDNISolone (MEDROL DOSEPAK) 4 MG TBPK tablet Take for full course per package instructions 21 tablet 0   Semaglutide-Weight Management 0.5 MG/0.5ML SOAJ Inject 0.5 mg into the skin once a week for  28 days. 2 mL 0   [START ON 11/13/2023] Semaglutide-Weight Management 1 MG/0.5ML SOAJ Inject 1 mg into the skin once a week for 28 days. 2 mL 0   sertraline (ZOLOFT) 100 MG tablet Take 1 tablet (100 mg total) by mouth daily. 90 tablet 2   No current facility-administered medications on file prior to visit.    No orders of the defined types were placed in this encounter.    Physical examination BP 111/73   Pulse 63   Ht 5\' 5"  (1.651 m)   Wt 197 lb (89.4 kg)   LMP 10/07/2023  (Approximate)   BMI 32.78 kg/m   General NAD, Conversant  HEENT Atraumatic; Op clear with mmm.  Normo-cephalic.  Anicteric sclerae  Thyroid/Neck Smooth without nodularity or enlargement. Normal ROM.  Neck Supple.  Skin No rashes, lesions or ulceration. Normal palpated skin turgor. No nodularity.  Breasts: No masses or discharge.  Symmetric.  No axillary adenopathy.  Lungs: Clear to auscultation.No rales or wheezes. Normal Respiratory effort, no retractions.  Heart: NSR.  No murmurs or rubs appreciated. No peripheral edema  Abdomen: Soft.  Non-tender.  No masses.  No HSM. No hernia  Extremities: Moves all appropriately.  Normal ROM for age. No lymphadenopathy.  Neuro: Oriented to PPT.  Normal mood. Normal affect.     Pelvic:   Vulva: Normal appearance.  No lesions.  Vagina: No lesions or abnormalities noted.  Support: Normal pelvic support.  Urethra No masses tenderness or scarring.  Meatus Normal size without lesions or prolapse.  Cervix: Normal ectropion.  No lesions.  Anus: Normal exam.  No lesions.  Perineum: Normal exam.  No lesions.        Bimanual   Uterus: Normal size.  Non-tender.  Mobile.  AV.  Adnexae: No masses.  Non-tender to palpation.  Cul-de-sac: Negative for abnormality.   Assessment:   [id number] Patient Active Problem List   Diagnosis Date Noted   Tinea corporis 10/21/2023   Allergic dermatitis 10/21/2023   Intramural leiomyoma of uterus 09/12/2023   Enlarged uterus 09/12/2023   Abnormal vaginal bleeding 08/30/2023   Encounter for weight management 09/21/2022   Annual physical exam 05/11/2022   OSA on CPAP 02/08/2022   Chronic fatigue 01/11/2022   Anxiety and depression 01/11/2022   Anemia 01/11/2022   S/P endometrial ablation 01/16/2021   CIN I (cervical intraepithelial neoplasia I) 11/22/2020   Cervical dysplasia 11/19/2016    1. Pre-op exam   2. Intramural leiomyoma of uterus   3. Right ovarian cyst   4. Dysmenorrhea   5. Enlarged uterus       Plan:   Orders: No orders of the defined types were placed in this encounter.    1.  LAVH bilateral salpingectomy

## 2023-11-11 DIAGNOSIS — G4733 Obstructive sleep apnea (adult) (pediatric): Secondary | ICD-10-CM | POA: Diagnosis not present

## 2023-11-15 ENCOUNTER — Encounter
Admission: RE | Admit: 2023-11-15 | Discharge: 2023-11-15 | Disposition: A | Discharge status: Home / Self Care | Source: Ambulatory Visit | Attending: Obstetrics and Gynecology | Admitting: Obstetrics and Gynecology

## 2023-11-15 DIAGNOSIS — Z01818 Encounter for other preprocedural examination: Secondary | ICD-10-CM

## 2023-11-15 DIAGNOSIS — Z01812 Encounter for preprocedural laboratory examination: Secondary | ICD-10-CM

## 2023-11-15 DIAGNOSIS — D649 Anemia, unspecified: Secondary | ICD-10-CM

## 2023-11-15 HISTORY — DX: Mild cervical dysplasia: N87.0

## 2023-11-15 HISTORY — DX: Abnormal uterine and vaginal bleeding, unspecified: N93.9

## 2023-11-15 HISTORY — DX: Pelvic and perineal pain: R10.2

## 2023-11-15 HISTORY — DX: Intramural leiomyoma of uterus: D25.1

## 2023-11-15 HISTORY — DX: Pelvic and perineal pain unspecified side: R10.20

## 2023-11-15 HISTORY — DX: Anemia, unspecified: D64.9

## 2023-11-15 HISTORY — DX: Other specified postprocedural states: Z98.890

## 2023-11-15 HISTORY — DX: Obstructive sleep apnea (adult) (pediatric): G47.33

## 2023-11-15 NOTE — Patient Instructions (Addendum)
 Your procedure is scheduled on:11-25-23 Monday Report to the Registration Desk on the 1st floor of the [facility].Then proceed to the 2nd floor Surgery Desk To find out your arrival time, please call [phone] between 1PM - 3PM on:11-22-23 Friday If your arrival time is 6:00 am, do not arrive before that time as the [facility] entrance doors do not open until 6:00 am.  REMEMBER: Instructions that are not followed completely may result in serious medical risk, up to and including death; or upon the discretion of your surgeon and anesthesiologist your surgery may need to be rescheduled.  Do not eat food OR drink any liquids after midnight the night before surgery.  No gum chewing or hard candies.  One week prior to surgery:Last dose will be on 11-17-23 Stop Anti-inflammatories (NSAIDS) such as Advil, Aleve, Ibuprofen, Motrin, Naproxen, Naprosyn and Aspirin based products such as Excedrin, Goody's Powder, BC Powder. Stop ANY OVER THE COUNTER supplements until after surgery ([employer] [employer])  You may however, continue to take Tylenol if needed for pain up until the day of surgery.  Stop Semaglutide 7 days prior to surgery-Do NOT take again until AFTER surgery  Continue taking all of your other prescription medications up until the day of surgery.  ON THE DAY OF SURGERY ONLY TAKE THESE MEDICATIONS WITH SIPS OF WATER: -Dextromethorphan-buPROPion ER (AUVELITY)  -sertraline (ZOLOFT)   No Alcohol for 24 hours before or after surgery.  No Smoking including e-cigarettes for 24 hours before surgery.  No chewable tobacco products for at least 6 hours before surgery.  No nicotine patches on the day of surgery.  Do not use any "recreational" drugs for at least a week (preferably 2 weeks) before your surgery.  Please be advised that the combination of cocaine and anesthesia may have negative outcomes, up to and including death. If you test positive for cocaine, your surgery will be  cancelled.  On the morning of surgery brush your teeth with toothpaste and water, you may rinse your mouth with mouthwash if you wish. Do not swallow any toothpaste or mouthwash.  Use CHG Soap as directed on instruction sheet.  Do not wear jewelry, make-up, hairpins, clips or nail polish.  For welded (permanent) jewelry: bracelets, anklets, waist bands, etc.  Please have this removed prior to surgery.  If it is not removed, there is a chance that hospital personnel will need to cut it off on the day of surgery.  Do not wear lotions, powders, or perfumes.   Do not shave body hair from the neck down 48 hours before surgery.  Contact lenses, hearing aids and dentures may not be worn into surgery.  Do not bring valuables to the hospital. [facility] is not responsible for any missing/lost belongings or valuables.   Bring your C-PAP to the hospital   Notify your doctor if there is any change in your medical condition (cold, fever, infection).  Wear comfortable clothing (specific to your surgery type) to the hospital.  After surgery, you can help prevent lung complications by doing breathing exercises.  Take deep breaths and cough every 1-2 hours. Your doctor may order a device called an Incentive Spirometer to help you take deep breaths. When coughing or sneezing, hold a pillow firmly against your incision with both hands. This is called "splinting." Doing this helps protect your incision. It also decreases belly discomfort.  If you are being admitted to the hospital overnight, leave your suitcase in the car. After surgery it may be  brought to your room.  In case of increased patient census, it may be necessary for you, the patient, to continue your postoperative care in the Same Day Surgery department.  If you are being discharged the day of surgery, you will not be allowed to drive home. You will need a responsible individual to drive you home and stay with you for 24 hours after  surgery.   If you are taking public transportation, you will need to have a responsible individual with you.  Please call the Pre-admissions Testing Dept. at [phone] if you have any questions about these instructions.  Surgery Visitation Policy:  Patients having surgery or a procedure may have two visitors.  Children under the age of 85 must have an adult with them who is not the patient.  Temporary Visitor Restrictions Due to increasing cases of flu, RSV and COVID-19: Children ages 15 and under will not be able to visit patients in [facility] hospitals under most circumstances.     Preparing for Surgery with CHLORHEXIDINE GLUCONATE (CHG) Soap  Chlorhexidine Gluconate (CHG) Soap  o An antiseptic cleaner that kills germs and bonds with the skin to continue killing germs even after washing  o Used for showering the night before surgery and morning of surgery  Before surgery, you can play an important role by reducing the number of germs on your skin.  CHG (Chlorhexidine gluconate) soap is an antiseptic cleanser which kills germs and bonds with the skin to continue killing germs even after washing.  Please do not use if you have an allergy to CHG or antibacterial soaps. If your skin becomes reddened/irritated stop using the CHG.  1. Shower the NIGHT BEFORE SURGERY and the MORNING OF SURGERY with CHG soap.  2. If you choose to wash your hair, wash your hair first as usual with your normal shampoo.  3. After shampooing, rinse your hair and body thoroughly to remove the shampoo.  4. Use CHG as you would any other liquid soap. You can apply CHG directly to the skin and wash gently with a scrungie or a clean washcloth.  5. Apply the CHG soap to your body only from the neck down. Do not use on open wounds or open sores. Avoid contact with your eyes, ears, mouth, and genitals (private parts). Wash face and genitals (private parts) with your normal soap.  6. Wash thoroughly,  paying special attention to the area where your surgery will be performed.  7. Thoroughly rinse your body with warm water.  8. Do not shower/wash with your normal soap after using and rinsing off the CHG soap.  9. Pat yourself dry with a clean towel.  10. Wear clean pajamas to bed the night before surgery.  12. Place clean sheets on your bed the night of your first shower and do not sleep with pets.  13. Shower again with the CHG soap on the day of surgery prior to arriving at the hospital.  14. Do not apply any deodorants/lotions/powders.  15. Please wear clean clothes to the hospital.

## 2023-11-19 ENCOUNTER — Encounter
Admission: RE | Admit: 2023-11-19 | Discharge: 2023-11-19 | Disposition: A | Discharge status: Home / Self Care | Source: Ambulatory Visit | Attending: Obstetrics and Gynecology | Admitting: Obstetrics and Gynecology

## 2023-11-19 DIAGNOSIS — Z01812 Encounter for preprocedural laboratory examination: Secondary | ICD-10-CM | POA: Diagnosis not present

## 2023-11-19 DIAGNOSIS — D649 Anemia, unspecified: Secondary | ICD-10-CM | POA: Insufficient documentation

## 2023-11-19 DIAGNOSIS — Z01818 Encounter for other preprocedural examination: Secondary | ICD-10-CM

## 2023-11-19 LAB — CBC
HCT: 37.6 % (ref 36.0–46.0)
Hemoglobin: 12.5 g/dL (ref 12.0–15.0)
MCH: 29.9 pg (ref 26.0–34.0)
MCHC: 33.2 g/dL (ref 30.0–36.0)
MCV: 90 fL (ref 80.0–100.0)
Platelets: 291 10*3/uL (ref 150–400)
RBC: 4.18 MIL/uL (ref 3.87–5.11)
RDW: 12.8 % (ref 11.5–15.5)
WBC: 6.9 10*3/uL (ref 4.0–10.5)
nRBC: 0 % (ref 0.0–0.2)

## 2023-11-19 LAB — TYPE AND SCREEN
ABO/RH(D): O POS
Antibody Screen: NEGATIVE

## 2023-11-25 ENCOUNTER — Ambulatory Visit: Admitting: Registered Nurse

## 2023-11-25 ENCOUNTER — Encounter: Payer: Self-pay | Admitting: Obstetrics and Gynecology

## 2023-11-25 ENCOUNTER — Other Ambulatory Visit: Payer: Self-pay

## 2023-11-25 ENCOUNTER — Ambulatory Visit
Admission: RE | Admit: 2023-11-25 | Discharge: 2023-11-25 | Disposition: A | Discharge status: Home / Self Care | Source: Ambulatory Visit | Attending: Obstetrics and Gynecology | Admitting: Obstetrics and Gynecology

## 2023-11-25 ENCOUNTER — Ambulatory Visit: Payer: Self-pay | Admitting: Urgent Care

## 2023-11-25 ENCOUNTER — Encounter
Admission: RE | Disposition: A | Discharge status: Home / Self Care | Payer: Self-pay | Source: Ambulatory Visit | Attending: Obstetrics and Gynecology

## 2023-11-25 DIAGNOSIS — D251 Intramural leiomyoma of uterus: Secondary | ICD-10-CM | POA: Diagnosis not present

## 2023-11-25 DIAGNOSIS — N946 Dysmenorrhea, unspecified: Secondary | ICD-10-CM | POA: Insufficient documentation

## 2023-11-25 DIAGNOSIS — F419 Anxiety disorder, unspecified: Secondary | ICD-10-CM | POA: Diagnosis not present

## 2023-11-25 DIAGNOSIS — G4733 Obstructive sleep apnea (adult) (pediatric): Secondary | ICD-10-CM | POA: Diagnosis not present

## 2023-11-25 DIAGNOSIS — N879 Dysplasia of cervix uteri, unspecified: Secondary | ICD-10-CM | POA: Diagnosis not present

## 2023-11-25 DIAGNOSIS — N736 Female pelvic peritoneal adhesions (postinfective): Secondary | ICD-10-CM | POA: Diagnosis not present

## 2023-11-25 DIAGNOSIS — N83201 Unspecified ovarian cyst, right side: Secondary | ICD-10-CM | POA: Diagnosis not present

## 2023-11-25 DIAGNOSIS — N870 Mild cervical dysplasia: Secondary | ICD-10-CM | POA: Diagnosis not present

## 2023-11-25 DIAGNOSIS — F32A Depression, unspecified: Secondary | ICD-10-CM | POA: Diagnosis not present

## 2023-11-25 DIAGNOSIS — Z9851 Tubal ligation status: Secondary | ICD-10-CM | POA: Diagnosis not present

## 2023-11-25 DIAGNOSIS — Z01818 Encounter for other preprocedural examination: Secondary | ICD-10-CM

## 2023-11-25 DIAGNOSIS — N888 Other specified noninflammatory disorders of cervix uteri: Secondary | ICD-10-CM | POA: Diagnosis not present

## 2023-11-25 DIAGNOSIS — Z87891 Personal history of nicotine dependence: Secondary | ICD-10-CM | POA: Diagnosis not present

## 2023-11-25 DIAGNOSIS — N852 Hypertrophy of uterus: Secondary | ICD-10-CM | POA: Diagnosis not present

## 2023-11-25 HISTORY — PX: LAPAROSCOPIC VAGINAL HYSTERECTOMY WITH SALPINGECTOMY: SHX6680

## 2023-11-25 LAB — ABO/RH: ABO/RH(D): O POS

## 2023-11-25 LAB — POCT PREGNANCY, URINE: Preg Test, Ur: NEGATIVE

## 2023-11-25 SURGERY — HYSTERECTOMY, VAGINAL, LAPAROSCOPY-ASSISTED, WITH SALPINGECTOMY
Anesthesia: General | Site: Vagina | Laterality: Bilateral

## 2023-11-25 MED ORDER — GABAPENTIN 300 MG PO CAPS
300.0000 mg | ORAL_CAPSULE | ORAL | Status: AC
  Administered 2023-11-25: 300 mg via ORAL

## 2023-11-25 MED ORDER — OXYCODONE HCL 5 MG PO TABS
5.0000 mg | ORAL_TABLET | Freq: Once | ORAL | Status: AC | PRN
  Administered 2023-11-25: 5 mg via ORAL

## 2023-11-25 MED ORDER — FENTANYL CITRATE (PF) 100 MCG/2ML IJ SOLN
25.0000 ug | INTRAMUSCULAR | Status: DC | PRN
  Administered 2023-11-25: 25 ug via INTRAVENOUS

## 2023-11-25 MED ORDER — OXYCODONE HCL 5 MG PO TABS
5.0000 mg | ORAL_TABLET | Freq: Once | ORAL | Status: AC
  Administered 2023-11-25: 5 mg via ORAL

## 2023-11-25 MED ORDER — BUPIVACAINE HCL 0.5 % IJ SOLN
INTRAMUSCULAR | Status: DC | PRN
  Administered 2023-11-25: 15 mL

## 2023-11-25 MED ORDER — ROCURONIUM BROMIDE 100 MG/10ML IV SOLN
INTRAVENOUS | Status: DC | PRN
  Administered 2023-11-25: 50 mg via INTRAVENOUS
  Administered 2023-11-25 (×3): 10 mg via INTRAVENOUS

## 2023-11-25 MED ORDER — ORAL CARE MOUTH RINSE: 15.0000 mL | Freq: Once | OROMUCOSAL | Status: AC

## 2023-11-25 MED ORDER — FENTANYL CITRATE (PF) 100 MCG/2ML IJ SOLN
INTRAMUSCULAR | Status: AC
  Filled 2023-11-25: qty 2

## 2023-11-25 MED ORDER — SODIUM CHLORIDE (PF) 0.9 % IJ SOLN
INTRAMUSCULAR | Status: AC
Start: 2023-11-25 — End: ?
  Filled 2023-11-25: qty 50

## 2023-11-25 MED ORDER — 0.9 % SODIUM CHLORIDE (POUR BTL) OPTIME
TOPICAL | Status: DC | PRN
  Administered 2023-11-25: 1000 mL

## 2023-11-25 MED ORDER — GABAPENTIN 300 MG PO CAPS
ORAL_CAPSULE | ORAL | Status: AC
  Filled 2023-11-25: qty 1

## 2023-11-25 MED ORDER — LACTATED RINGERS IV SOLN: INTRAVENOUS | Status: DC

## 2023-11-25 MED ORDER — DROPERIDOL 2.5 MG/ML IJ SOLN: 0.6250 mg | Freq: Once | INTRAMUSCULAR | Status: DC | PRN

## 2023-11-25 MED ORDER — FENTANYL CITRATE (PF) 100 MCG/2ML IJ SOLN
INTRAMUSCULAR | Status: DC | PRN
  Administered 2023-11-25 (×2): 50 ug via INTRAVENOUS

## 2023-11-25 MED ORDER — EPHEDRINE SULFATE-NACL 50-0.9 MG/10ML-% IV SOSY
PREFILLED_SYRINGE | INTRAVENOUS | Status: DC | PRN
  Administered 2023-11-25: 5 mg via INTRAVENOUS
  Administered 2023-11-25 (×2): 10 mg via INTRAVENOUS

## 2023-11-25 MED ORDER — EPHEDRINE 5 MG/ML INJ
INTRAVENOUS | Status: AC
Start: 2023-11-25 — End: ?
  Filled 2023-11-25: qty 5

## 2023-11-25 MED ORDER — LIDOCAINE HCL (CARDIAC) PF 100 MG/5ML IV SOSY
PREFILLED_SYRINGE | INTRAVENOUS | Status: DC | PRN
  Administered 2023-11-25: 100 mg via INTRAVENOUS

## 2023-11-25 MED ORDER — CHLORHEXIDINE GLUCONATE 0.12 % MT SOLN
OROMUCOSAL | Status: AC
Start: 2023-11-25 — End: ?
  Filled 2023-11-25: qty 15

## 2023-11-25 MED ORDER — CEFAZOLIN SODIUM-DEXTROSE 2-4 GM/100ML-% IV SOLN
INTRAVENOUS | Status: AC
  Filled 2023-11-25: qty 100

## 2023-11-25 MED ORDER — CELECOXIB 200 MG PO CAPS
400.0000 mg | ORAL_CAPSULE | ORAL | Status: AC
Start: 2023-11-25 — End: 2023-11-25
  Administered 2023-11-25: 400 mg via ORAL

## 2023-11-25 MED ORDER — CHLORHEXIDINE GLUCONATE 0.12 % MT SOLN
15.0000 mL | Freq: Once | OROMUCOSAL | Status: AC
  Administered 2023-11-25: 15 mL via OROMUCOSAL

## 2023-11-25 MED ORDER — VASOPRESSIN 20 UNIT/ML IV SOLN
INTRAVENOUS | Status: DC | PRN
  Administered 2023-11-25: 15 mL via INTRAMUSCULAR

## 2023-11-25 MED ORDER — DEXMEDETOMIDINE HCL IN NACL 80 MCG/20ML IV SOLN
INTRAVENOUS | Status: DC | PRN
  Administered 2023-11-25 (×2): 8 ug via INTRAVENOUS
  Administered 2023-11-25: 4 ug via INTRAVENOUS

## 2023-11-25 MED ORDER — KETOROLAC TROMETHAMINE 30 MG/ML IJ SOLN
INTRAMUSCULAR | Status: AC
  Filled 2023-11-25: qty 1

## 2023-11-25 MED ORDER — PHENYLEPHRINE 80 MCG/ML (10ML) SYRINGE FOR IV PUSH (FOR BLOOD PRESSURE SUPPORT)
PREFILLED_SYRINGE | INTRAVENOUS | Status: DC | PRN
  Administered 2023-11-25: 80 ug via INTRAVENOUS

## 2023-11-25 MED ORDER — POVIDONE-IODINE 10 % EX SWAB
2.0000 | Freq: Once | CUTANEOUS | Status: AC
  Administered 2023-11-25: 2 via TOPICAL

## 2023-11-25 MED ORDER — KETOROLAC TROMETHAMINE 30 MG/ML IJ SOLN
30.0000 mg | Freq: Once | INTRAMUSCULAR | Status: AC
Start: 2023-11-25 — End: 2023-11-25
  Administered 2023-11-25: 30 mg via INTRAVENOUS

## 2023-11-25 MED ORDER — PROPOFOL 10 MG/ML IV BOLUS
INTRAVENOUS | Status: DC | PRN
  Administered 2023-11-25: 160 mg via INTRAVENOUS

## 2023-11-25 MED ORDER — MIDAZOLAM HCL 2 MG/2ML IJ SOLN
INTRAMUSCULAR | Status: DC | PRN
  Administered 2023-11-25: 2 mg via INTRAVENOUS

## 2023-11-25 MED ORDER — ACETAMINOPHEN 500 MG PO TABS
ORAL_TABLET | ORAL | Status: AC
  Filled 2023-11-25: qty 2

## 2023-11-25 MED ORDER — CEFAZOLIN SODIUM-DEXTROSE 2-4 GM/100ML-% IV SOLN
2.0000 g | INTRAVENOUS | Status: AC
Start: 2023-11-25 — End: 2023-11-25
  Administered 2023-11-25: 2 g via INTRAVENOUS

## 2023-11-25 MED ORDER — CELECOXIB 200 MG PO CAPS
ORAL_CAPSULE | ORAL | Status: AC
  Filled 2023-11-25: qty 2

## 2023-11-25 MED ORDER — ONDANSETRON HCL 4 MG/2ML IJ SOLN
INTRAMUSCULAR | Status: DC | PRN
  Administered 2023-11-25: 4 mg via INTRAVENOUS

## 2023-11-25 MED ORDER — OXYCODONE HCL 5 MG/5ML PO SOLN: 5.0000 mg | Freq: Once | ORAL | Status: AC | PRN

## 2023-11-25 MED ORDER — OXYCODONE HCL 5 MG PO TABS
ORAL_TABLET | ORAL | Status: AC
  Filled 2023-11-25: qty 1

## 2023-11-25 MED ORDER — DEXAMETHASONE SODIUM PHOSPHATE 10 MG/ML IJ SOLN
INTRAMUSCULAR | Status: DC | PRN
  Administered 2023-11-25: 10 mg via INTRAVENOUS

## 2023-11-25 MED ORDER — MIDAZOLAM HCL 2 MG/2ML IJ SOLN
INTRAMUSCULAR | Status: AC
Start: 2023-11-25 — End: ?
  Filled 2023-11-25: qty 2

## 2023-11-25 MED ORDER — ACETAMINOPHEN 10 MG/ML IV SOLN: 1000.0000 mg | Freq: Once | INTRAVENOUS | Status: DC | PRN

## 2023-11-25 MED ORDER — VASOPRESSIN 20 UNIT/ML IV SOLN
INTRAVENOUS | Status: AC
  Filled 2023-11-25: qty 1

## 2023-11-25 MED ORDER — BUPIVACAINE HCL (PF) 0.5 % IJ SOLN
INTRAMUSCULAR | Status: AC
  Filled 2023-11-25: qty 30

## 2023-11-25 MED ORDER — ACETAMINOPHEN 500 MG PO TABS
1000.0000 mg | ORAL_TABLET | ORAL | Status: AC
Start: 2023-11-25 — End: 2023-11-25
  Administered 2023-11-25: 1000 mg via ORAL

## 2023-11-25 MED ORDER — SUGAMMADEX SODIUM 200 MG/2ML IV SOLN
INTRAVENOUS | Status: DC | PRN
  Administered 2023-11-25: 357.6 mg via INTRAVENOUS

## 2023-11-25 MED ORDER — SUGAMMADEX SODIUM 200 MG/2ML IV SOLN
INTRAVENOUS | Status: AC
  Filled 2023-11-25: qty 4

## 2023-11-25 MED ORDER — HYDROCODONE-ACETAMINOPHEN 5-325 MG PO TABS
1.0000 | ORAL_TABLET | Freq: Four times a day (QID) | ORAL | 0 refills | Status: DC | PRN
Start: 2023-11-25 — End: 2023-12-11

## 2023-11-25 SURGICAL SUPPLY — 46 items
ADHESIVE MASTISOL STRL (MISCELLANEOUS) IMPLANT
BAG URINE DRAIN 2000ML AR STRL (UROLOGICAL SUPPLIES) ×1 IMPLANT
BLADE SURG SZ11 CARB STEEL (BLADE) ×1 IMPLANT
CATH URTH 16FR FL 2W BLN LF (CATHETERS) ×1 IMPLANT
CHLORAPREP W/TINT 26 (MISCELLANEOUS) ×1 IMPLANT
DERMABOND ADVANCED .7 DNX12 (GAUZE/BANDAGES/DRESSINGS) ×1 IMPLANT
ELECT REM PT RETURN 9FT ADLT (ELECTROSURGICAL) ×1 IMPLANT
ELECTRODE REM PT RTRN 9FT ADLT (ELECTROSURGICAL) ×1 IMPLANT
GAUZE 4X4 16PLY ~~LOC~~+RFID DBL (SPONGE) ×1 IMPLANT
GLOVE PI ORTHO PRO STRL 7.5 (GLOVE) ×2 IMPLANT
GOWN STRL REUS W/ TWL LRG LVL3 (GOWN DISPOSABLE) ×1 IMPLANT
GOWN STRL REUS W/ TWL XL LVL3 (GOWN DISPOSABLE) ×2 IMPLANT
HANDLE YANKAUER SUCT BULB TIP (MISCELLANEOUS) IMPLANT
IRRIGATION STRYKERFLOW (MISCELLANEOUS) IMPLANT
IRRIGATOR STRYKERFLOW (MISCELLANEOUS) IMPLANT
IV LACTATED RINGERS 1000ML (IV SOLUTION) ×1 IMPLANT
KIT PINK PAD W/HEAD ARE REST (MISCELLANEOUS) ×1 IMPLANT
KIT PINK PAD W/HEAD ARM REST (MISCELLANEOUS) ×1 IMPLANT
KIT TURNOVER CYSTO (KITS) ×1 IMPLANT
LIGASURE LAP MARYLAND 5MM 37CM (ELECTROSURGICAL) IMPLANT
MANIFOLD NEPTUNE II (INSTRUMENTS) ×1 IMPLANT
NDL SPNL 22GX3.5 QUINCKE BK (NEEDLE) ×1 IMPLANT
NEEDLE SPNL 22GX3.5 QUINCKE BK (NEEDLE) ×1 IMPLANT
NS IRRIG 1000ML POUR BTL (IV SOLUTION) IMPLANT
PACK BASIN MINOR ARMC (MISCELLANEOUS) ×1 IMPLANT
PACK GYN LAPAROSCOPIC (MISCELLANEOUS) ×1 IMPLANT
PAD OB MATERNITY 11 LF (PERSONAL CARE ITEMS) ×1 IMPLANT
PAD PREP OB/GYN DISP 24X41 (PERSONAL CARE ITEMS) ×1 IMPLANT
PENCIL SMOKE EVACUATOR (MISCELLANEOUS) IMPLANT
RETRACTOR PHONTONGUIDE ADAPT (ADAPTER) IMPLANT
SCRUB CHG 4% DYNA-HEX 4OZ (MISCELLANEOUS) ×1 IMPLANT
SLEEVE Z-THREAD 5X100MM (TROCAR) ×2 IMPLANT
SOL ELECTROSURG ANTI STICK (MISCELLANEOUS) IMPLANT
SOLUTION ELECTROSURG ANTI STCK (MISCELLANEOUS) IMPLANT
SPONGE T-LAP 18X18 ~~LOC~~+RFID (SPONGE) IMPLANT
STRIP CLOSURE SKIN 1/2X4 (GAUZE/BANDAGES/DRESSINGS) IMPLANT
SUT VIC AB 0 CT1 27XCR 8 STRN (SUTURE) ×2 IMPLANT
SUT VIC AB 0 CT1 36 (SUTURE) ×1 IMPLANT
SUT VIC AB 4-0 FS2 27 (SUTURE) ×1 IMPLANT
SYR 10ML LL (SYRINGE) ×1 IMPLANT
SYR CONTROL 10ML LL (SYRINGE) ×1 IMPLANT
TAPE TRANSPORE STRL 2 31045 (GAUZE/BANDAGES/DRESSINGS) ×1 IMPLANT
TRAP FLUID SMOKE EVACUATOR (MISCELLANEOUS) ×1 IMPLANT
TROCAR Z-THREAD FIOS 5X100MM (TROCAR) ×1 IMPLANT
TUBING EVAC SMOKE HEATED PNEUM (TUBING) ×1 IMPLANT
WATER STERILE IRR 500ML POUR (IV SOLUTION) ×1 IMPLANT

## 2023-11-25 NOTE — Anesthesia Preprocedure Evaluation (Signed)
 Anesthesia Evaluation  Patient identified by MRN, date of birth, ID band Patient awake    Reviewed: Allergy & Precautions, H&P , NPO status , Patient's Chart, lab work & pertinent test results, reviewed documented beta blocker date and time   Airway Mallampati: III  TM Distance: >3 FB Neck ROM: full    Dental  (+) Teeth Intact, Missing, Partial Upper   Pulmonary sleep apnea and Continuous Positive Airway Pressure Ventilation , Patient abstained from smoking., former smoker   Pulmonary exam normal        Cardiovascular Exercise Tolerance: Good negative cardio ROS Normal cardiovascular exam Rhythm:regular Rate:Normal     Neuro/Psych  PSYCHIATRIC DISORDERS Anxiety Depression    negative neurological ROS     GI/Hepatic negative GI ROS, Neg liver ROS,,,  Endo/Other  negative endocrine ROS    Renal/GU negative Renal ROS  negative genitourinary   Musculoskeletal   Abdominal   Peds  Hematology  (+) Blood dyscrasia, anemia   Anesthesia Other Findings Past Medical History: No date: Abnormal vaginal bleeding No date: Anemia No date: Anxiety No date: Depression No date: Dysplasia of cervix, low grade (CIN 1) No date: Intramural leiomyoma of uterus 11/19/2016: Menorrhagia with regular cycle     Comment:  very light to nonexistent monthly period flow No date: OSA on CPAP No date: Pelvic pain No date: S/P endometrial ablation Past Surgical History: No date: DENTAL RESTORATION/EXTRACTION WITH X-RAY 2022: ENDOMETRIAL ABLATION 2012: TUBAL LIGATION BMI    Body Mass Index: 32.78 kg/m     Reproductive/Obstetrics negative OB ROS                             Anesthesia Physical Anesthesia Plan  ASA: 3  Anesthesia Plan: General ETT   Post-op Pain Management:    Induction:   PONV Risk Score and Plan: 4 or greater  Airway Management Planned:   Additional Equipment:   Intra-op Plan:    Post-operative Plan:   Informed Consent: I have reviewed the patients History and Physical, chart, labs and discussed the procedure including the risks, benefits and alternatives for the proposed anesthesia with the patient or authorized representative who has indicated his/her understanding and acceptance.     Dental Advisory Given  Plan Discussed with: CRNA  Anesthesia Plan Comments:        Anesthesia Quick Evaluation

## 2023-11-25 NOTE — Interval H&P Note (Signed)
 History and Physical Interval Note:  11/25/2023 10:16 AM  Lynn Zuniga  has presented today for surgery, with the diagnosis of Dysmenorrhea failed ablation.  The various methods of treatment have been discussed with the patient and family. After consideration of risks, benefits and other options for treatment, the patient has consented to  Procedure(s): HYSTERECTOMY, VAGINAL, LAPAROSCOPY-ASSISTED, WITH SALPINGECTOMY (Bilateral) as a surgical intervention.  The patient's history has been reviewed, patient examined, no change in status, stable for surgery.  I have reviewed the patient's chart and labs.  Questions were answered to the patient's satisfaction.     Brennan Bailey

## 2023-11-25 NOTE — Anesthesia Procedure Notes (Signed)
 Procedure Name: Intubation Date/Time: 11/25/2023 10:19 AM  Performed by: Stormy Fabian, CRNAPre-anesthesia Checklist: Patient identified, Patient being monitored, Timeout performed, Emergency Drugs available and Suction available Patient Re-evaluated:Patient Re-evaluated prior to induction Oxygen Delivery Method: Circle system utilized Preoxygenation: Pre-oxygenation with 100% oxygen Induction Type: IV induction Ventilation: Mask ventilation without difficulty Laryngoscope Size: Mac and 3 Grade View: Grade I Tube type: Oral Tube size: 7.0 mm Number of attempts: 1 Airway Equipment and Method: Stylet Placement Confirmation: ETT inserted through vocal cords under direct vision, positive ETCO2 and breath sounds checked- equal and bilateral Secured at: 21 cm Tube secured with: Tape Dental Injury: Teeth and Oropharynx as per pre-operative assessment

## 2023-11-25 NOTE — Op Note (Signed)
 OPERATIVE NOTE 11/25/2023 11:59 AM  PRE-OPERATIVE DIAGNOSIS:  1) Dysmenorrhea failed ablation  POST-OPERATIVE DIAGNOSIS:  Post-Op Diagnosis Codes:    * Intramural leiomyoma of uterus [D25.1]    * Right ovarian cyst [N83.201]    * Dysmenorrhea [N94.6]    * Enlarged uterus [N85.2]  OPERATION: Procedure(s) (LRB): HYSTERECTOMY, VAGINAL, LAPAROSCOPY-ASSISTED, WITH SALPINGECTOMY (Bilateral)    SURGEON(S): Surgeons and Role:    Linzie Collin, MD - Primary    * Hildred Laser, MD - Assisting   ANESTHESIA: General  ESTIMATED BLOOD LOSS:  SPECIMEN:  ID Type Source Tests Collected by Time Destination  1 : Cervix, uterus, bilateral fallopian tubes Tissue PATH Gyn benign resection SURGICAL PATHOLOGY Linzie Collin, MD 11/25/2023 1124     COMPLICATIONS: None  DRAINS: None  DISPOSITION: Stable to recovery room  DESCRIPTION OF PROCEDURE:      The patient was prepped and draped in the dorsolithotomy position and placed under general anesthesia. The bladder was emptied. The cervix was grasped with a multi-toothed tenaculum and a uterine manipulator was placed within the cervical os respecting the position and curvature of the uterus. After changing gloves we proceeded abdominally. A small infraumbilical incision was made and a 5 mm trocar port was placed within the abdominopelvic cavity. The opening pressure was less than 7 mmHg.  Approximately 3 and 1/2 L of carbon dioxide gas was instilled within the abdominal pelvic cavity. The laparoscope was placed and the pelvis and abdomen were carefully inspected. In the usual manner, under direct visualization right and left lower quadrant ports of 5 mm size were placed. Both ureters were identified in the pelvis prior to dissection or clamping and cutting of pedicles. The fallopian tubes were elevated and the mesenteric side systematically coagulated and divided allowing the tube to be removed at the time of uterine removal. The  round ligaments were coagulated and divided and a bladder flap was created. The upper aspect of the broad ligament was clamped coagulated and divided. The uterine arteries were skeletonized, triply coagulated and divided. Careful inspection of all pedicles and the remainder of the pelvis was performed. Hemostasis was noted. The lower quadrant ports were removed, hemostasis of the port sites was noted, and the incisions were closed in subcuticular manner. The laparoscope and trocar sleeve were removed from the infraumbilical incision, hemostasis was noted, and the incision was closed in a subcuticular manner. A long-acting anesthetic was employed in the skin incisions. We then proceeded vaginally. A weighted speculum was placed posteriorly. A multi-toothed tenaculum was used to grasp the cervix and the cervix was injected in a circumferential manner with a dilute Pitressin solution. An incision was made around the cervix and the vaginal mucosa was dissected off of the cervix. The posterior cul-de-sac was identified and entered and the weighted speculum was placed within this. The anterior cul-de-sac was identified and entered and a retractor was placed and used to retract the bladder anteriorly keeping it out of the operative field. The uterosacral ligaments were clamped divided and suture ligated. The cardinal ligaments were clamped divided and suture ligated. The small remaining pedicle was clamped divided and suture ligated bilaterally allowing delivery of the specimen. Angle sutures were placed in the usual manner. A culdoplasty was performed. The peritoneum was identified anteriorly and then incorporating the left upper pedicle left lower pedicle right lower pedicle right upper pedicle and anterior peritoneum a pursestring suture was placed exteriorizing all pedicles. Hemostasis of all pedicles was noted  at this time. The vaginal mucosa was then closed with a running suture of Vicryl. Dr. Valentino Saxon provided  exposure, dissection, suctioning, retraction, and general support and assistance during the procedure.  The patient went to recovery room in stable condition.   Elonda Husky, M.D. 11/25/2023 11:59 AM

## 2023-11-25 NOTE — Anesthesia Postprocedure Evaluation (Signed)
 Anesthesia Post Note  Patient: Lynn Zuniga  Procedure(s) Performed: HYSTERECTOMY, VAGINAL, LAPAROSCOPY-ASSISTED, WITH SALPINGECTOMY (Bilateral: Vagina )  Patient location during evaluation: PACU Anesthesia Type: General Level of consciousness: awake and alert Pain management: pain level controlled Vital Signs Assessment: post-procedure vital signs reviewed and stable Respiratory status: spontaneous breathing, nonlabored ventilation, respiratory function stable and patient connected to nasal cannula oxygen Cardiovascular status: blood pressure returned to baseline and stable Postop Assessment: no apparent nausea or vomiting Anesthetic complications: no   No notable events documented.   Last Vitals:  Vitals:   11/25/23 1255 11/25/23 1333  BP:  109/60  Pulse: 76 72  Resp: 12 16  Temp:  (!) 36.3 C  SpO2: 97% 95%    Last Pain:  Vitals:   11/25/23 1333  TempSrc: Temporal  PainSc: 5                  Lynn Zuniga

## 2023-11-25 NOTE — Transfer of Care (Signed)
 Immediate Anesthesia Transfer of Care Note  Patient: Lynn Zuniga  Procedure(s) Performed: Procedure(s): HYSTERECTOMY, VAGINAL, LAPAROSCOPY-ASSISTED, WITH SALPINGECTOMY (Bilateral)  Patient Location: PACU  Anesthesia Type:General  Level of Consciousness: sedated  Airway & Oxygen Therapy: Patient Spontanous Breathing and Patient connected to face mask oxygen  Post-op Assessment: Report given to RN and Post -op Vital signs reviewed and stable  Post vital signs: Reviewed and stable  Last Vitals:  Vitals:   11/25/23 0800 11/25/23 1209  BP: (!) 107/56 (!) 111/53  Pulse: 64 69  Resp: 18 15  Temp: 36.6 C 36.6 C  SpO2: 99% 98%    Complications: No apparent anesthesia complications

## 2023-11-25 NOTE — Progress Notes (Signed)
 Patient ambulated at nurses station and to the bathroom. Patient assisted to bathroom and is attempting to void.

## 2023-11-26 ENCOUNTER — Encounter: Payer: Self-pay | Admitting: Obstetrics and Gynecology

## 2023-11-27 LAB — SURGICAL PATHOLOGY

## 2023-11-28 ENCOUNTER — Ambulatory Visit: Payer: Self-pay | Admitting: Family Medicine

## 2023-11-28 DIAGNOSIS — Z0289 Encounter for other administrative examinations: Secondary | ICD-10-CM

## 2023-12-11 ENCOUNTER — Encounter: Payer: Self-pay | Admitting: Obstetrics and Gynecology

## 2023-12-11 ENCOUNTER — Ambulatory Visit (INDEPENDENT_AMBULATORY_CARE_PROVIDER_SITE_OTHER): Admitting: Obstetrics and Gynecology

## 2023-12-11 VITALS — BP 103/68 | HR 71 | Ht 65.0 in | Wt 199.0 lb

## 2023-12-11 DIAGNOSIS — Z9889 Other specified postprocedural states: Secondary | ICD-10-CM

## 2023-12-11 DIAGNOSIS — Z4889 Encounter for other specified surgical aftercare: Secondary | ICD-10-CM

## 2023-12-11 NOTE — Progress Notes (Signed)
 Patient presents for 2 week postop follow-up following LAVH. She states  still having some brownish discharge but no other complaints.

## 2023-12-11 NOTE — Progress Notes (Signed)
 HPI:      [name] is a 38 y.o. [id number] who LMP was Patient's last menstrual period was 10/07/2023 (approximate).  Subjective:   She presents today 2 weeks from LAVH.  She reports she is doing well.  Denies pain.  Ambulating voiding eating and having bowel movements without difficulty.  Not using narcotics. Has some small amounts of occasional brown spotting/vaginal discharge.    Hx: The following portions of the patient's history were reviewed and updated as appropriate:             She  has a past medical history of Abnormal vaginal bleeding, Anemia, Anxiety, Depression, Dysplasia of cervix, low grade (CIN 1), Intramural leiomyoma of uterus, Menorrhagia with regular cycle (11/19/2016), OSA on CPAP, Pelvic pain, and S/P endometrial ablation. She does not have any pertinent problems on file. She  has a past surgical history that includes Tubal ligation (2012); Dental restoration/extraction with x-ray; Endometrial ablation (2022); and Laparoscopic vaginal hysterectomy with salpingectomy (Bilateral, 11/25/2023). Her family history includes Brain cancer in her maternal aunt; Breast cancer in her paternal grandmother; Cancer in her maternal grandfather, maternal grandmother, and paternal grandmother; Lung cancer in her maternal aunt. She  reports that she has quit smoking. Her smoking use included e-cigarettes. She has never used smokeless tobacco. She reports current alcohol use. She reports that she does not use drugs. She has a current medication list which includes the following prescription(s): auvelity, sertraline, and semaglutide-weight management. She has no known allergies.       Review of Systems:  Review of Systems  Constitutional: Denied constitutional symptoms, night sweats, recent illness, fatigue, fever, insomnia and weight loss.  Eyes: Denied eye symptoms, eye pain, photophobia, vision change and visual disturbance.  Ears/Nose/Throat/Neck: Denied ear, nose, throat or  neck symptoms, hearing loss, nasal discharge, sinus congestion and sore throat.  Cardiovascular: Denied cardiovascular symptoms, arrhythmia, chest pain/pressure, edema, exercise intolerance, orthopnea and palpitations.  Respiratory: Denied pulmonary symptoms, asthma, pleuritic pain, productive sputum, cough, dyspnea and wheezing.  Gastrointestinal: Denied, gastro-esophageal reflux, melena, nausea and vomiting.  Genitourinary: Denied genitourinary symptoms including symptomatic vaginal discharge, pelvic relaxation issues, and urinary complaints.  Musculoskeletal: Denied musculoskeletal symptoms, stiffness, swelling, muscle weakness and myalgia.  Dermatologic: Denied dermatology symptoms, rash and scar.  Neurologic: Denied neurology symptoms, dizziness, headache, neck pain and syncope.  Psychiatric: Denied psychiatric symptoms, anxiety and depression.  Endocrine: Denied endocrine symptoms including hot flashes and night sweats.   Meds:   Current Outpatient Medications on File Prior to Visit  Medication Sig Dispense Refill   Dextromethorphan-buPROPion ER (AUVELITY) 45-105 MG TBCR TAKE 1 TABLET BY MOUTH TWICE A DAY (Patient taking differently: Take 1 tablet by mouth in the morning.) 180 tablet 1   sertraline (ZOLOFT) 100 MG tablet Take 1 tablet (100 mg total) by mouth daily. (Patient taking differently: Take 100 mg by mouth every morning.) 90 tablet 2   Semaglutide-Weight Management 1 MG/0.5ML SOAJ Inject 1 mg into the skin once a week for 28 days. (Patient not taking: Reported on 12/11/2023) 2 mL 0   No current facility-administered medications on file prior to visit.      Objective:     Vitals:   12/11/23 0934  BP: 103/68  Pulse: 71   Filed Weights   12/11/23 0934  Weight: 199 lb (90.3 kg)               Abdomen: Soft.  Non-tender.  No masses.  No HSM.  Incision/s: Intact.  Healing well.  No erythema.  No drainage.             Assessment:    [id number] Patient Active Problem  List   Diagnosis Date Noted   Tinea corporis 10/21/2023   Allergic dermatitis 10/21/2023   Intramural leiomyoma of uterus 09/12/2023   Enlarged uterus 09/12/2023   Abnormal vaginal bleeding 08/30/2023   Encounter for weight management 09/21/2022   Annual physical exam 05/11/2022   OSA on CPAP 02/08/2022   Chronic fatigue 01/11/2022   Anxiety and depression 01/11/2022   Anemia 01/11/2022   S/P endometrial ablation 01/16/2021   CIN I (cervical intraepithelial neoplasia I) 11/22/2020   Cervical dysplasia 11/19/2016     1. Postoperative state     Excellent recovery   Plan:            1.  Pathology reviewed.  2.  Patient may resume many activities with exception of heavy lifting and nothing in the vagina.  Discussed wound care. Orders No orders of the defined types were placed in this encounter.   No orders of the defined types were placed in this encounter.     F/U  Return in about 4 weeks (around 01/08/2024).  Elonda Husky, M.D. 12/11/2023 9:50 AM

## 2023-12-20 ENCOUNTER — Encounter: Payer: Self-pay | Admitting: Obstetrics and Gynecology

## 2024-01-08 ENCOUNTER — Ambulatory Visit (INDEPENDENT_AMBULATORY_CARE_PROVIDER_SITE_OTHER): Admitting: Obstetrics and Gynecology

## 2024-01-08 ENCOUNTER — Encounter: Payer: Self-pay | Admitting: Obstetrics and Gynecology

## 2024-01-08 VITALS — BP 100/66 | HR 76 | Ht 65.0 in | Wt 197.6 lb

## 2024-01-08 DIAGNOSIS — Z9889 Other specified postprocedural states: Secondary | ICD-10-CM

## 2024-01-08 NOTE — Progress Notes (Signed)
 HPI:      [name] is a 38 y.o. [id number] who LMP was Patient's last menstrual period was 10/07/2023 (approximate).  Subjective:   She presents today 6 weeks postop from LAVH for uterine fibroids.  She is doing well.  She reports that she has returned to work.  She is having minimal pelvic pain.  She says work is harder because she gets tired easier.  She has noticed some spotting after hard day at work.    Hx: The following portions of the patient's history were reviewed and updated as appropriate:             She  has a past medical history of Abnormal vaginal bleeding, Anemia, Anxiety, Depression, Dysplasia of cervix, low grade (CIN 1), Intramural leiomyoma of uterus, Menorrhagia with regular cycle (11/19/2016), OSA on CPAP, Pelvic pain, and S/P endometrial ablation. She does not have any pertinent problems on file. She  has a past surgical history that includes Tubal ligation (2012); Dental restoration/extraction with x-ray; Endometrial ablation (2022); and Laparoscopic vaginal hysterectomy with salpingectomy (Bilateral, 11/25/2023). Her family history includes Brain cancer in her maternal aunt; Breast cancer in her paternal grandmother; Cancer in her maternal grandfather, maternal grandmother, and paternal grandmother; Lung cancer in her maternal aunt. She  reports that she has quit smoking. Her smoking use included e-cigarettes. She has never used smokeless tobacco. She reports current alcohol use. She reports that she does not use drugs. She has a current medication list which includes the following prescription(s): auvelity  and sertraline . She has no known allergies.       Review of Systems:  Review of Systems  Constitutional: Denied constitutional symptoms, night sweats, recent illness, fatigue, fever, insomnia and weight loss.  Eyes: Denied eye symptoms, eye pain, photophobia, vision change and visual disturbance.  Ears/Nose/Throat/Neck: Denied ear, nose, throat or neck  symptoms, hearing loss, nasal discharge, sinus congestion and sore throat.  Cardiovascular: Denied cardiovascular symptoms, arrhythmia, chest pain/pressure, edema, exercise intolerance, orthopnea and palpitations.  Respiratory: Denied pulmonary symptoms, asthma, pleuritic pain, productive sputum, cough, dyspnea and wheezing.  Gastrointestinal: Denied, gastro-esophageal reflux, melena, nausea and vomiting.  Genitourinary: Denied genitourinary symptoms including symptomatic vaginal discharge, pelvic relaxation issues, and urinary complaints.  Musculoskeletal: Denied musculoskeletal symptoms, stiffness, swelling, muscle weakness and myalgia.  Dermatologic: Denied dermatology symptoms, rash and scar.  Neurologic: Denied neurology symptoms, dizziness, headache, neck pain and syncope.  Psychiatric: Denied psychiatric symptoms, anxiety and depression.  Endocrine: Denied endocrine symptoms including hot flashes and night sweats.   Meds:   Current Outpatient Medications on File Prior to Visit  Medication Sig Dispense Refill   Dextromethorphan-buPROPion ER (AUVELITY ) 45-105 MG TBCR TAKE 1 TABLET BY MOUTH TWICE A DAY (Patient taking differently: Take 1 tablet by mouth in the morning.) 180 tablet 1   sertraline  (ZOLOFT ) 100 MG tablet Take 1 tablet (100 mg total) by mouth daily. (Patient taking differently: Take 100 mg by mouth every morning.) 90 tablet 2   No current facility-administered medications on file prior to visit.      Objective:     Vitals:   01/08/24 1048  BP: 100/66  Pulse: 76   Filed Weights   01/08/24 1048  Weight: 197 lb 9.6 oz (89.6 kg)              Physical examination   Pelvic:   Vulva: Normal appearance.  No lesions.  Vagina: No lesions or abnormalities noted.  Granulation tissue noted at the vaginal cuff -silver nitrate applied  Support: Normal pelvic support.  Urethra No masses tenderness or scarring.  Meatus Normal size without lesions or prolapse.  Cervix:  Surgically absent  Anus: Normal exam.  No lesions.  Perineum: Normal exam.  No lesions.        Bimanual   Uterus: Surgically absent  Adnexae: No masses.  Non-tender to palpation.  Cul-de-sac: Negative for abnormality.             Assessment:    [id number] Patient Active Problem List   Diagnosis Date Noted   Tinea corporis 10/21/2023   Allergic dermatitis 10/21/2023   Intramural leiomyoma of uterus 09/12/2023   Enlarged uterus 09/12/2023   Abnormal vaginal bleeding 08/30/2023   Encounter for weight management 09/21/2022   Annual physical exam 05/11/2022   OSA on CPAP 02/08/2022   Chronic fatigue 01/11/2022   Anxiety and depression 01/11/2022   Anemia 01/11/2022   S/P endometrial ablation 01/16/2021   CIN I (cervical intraepithelial neoplasia I) 11/22/2020   Cervical dysplasia 11/19/2016     1. Postoperative state     Excellent recovery postop.  Patient doing well   Plan:            1.  May resume normal activities with exception of heavy lifting.  I have asked her to refrain from intercourse for the next 2 weeks to give the cuff some additional time to heal up.    Orders No orders of the defined types were placed in this encounter.   No orders of the defined types were placed in this encounter.     F/U  Return in about 3 months (around 04/09/2024).  Delice Felt, M.D. 01/08/2024 11:09 AM

## 2024-01-08 NOTE — Progress Notes (Signed)
 Patient presents for 6 week postop follow-up following LAVH. She states she returned to work Monday and does stet at the end of day she noticed some spotting and soreness. No additional concerns.

## 2024-01-20 ENCOUNTER — Ambulatory Visit: Payer: BC Managed Care – PPO | Admitting: Family Medicine

## 2024-01-24 ENCOUNTER — Ambulatory Visit (INDEPENDENT_AMBULATORY_CARE_PROVIDER_SITE_OTHER): Admitting: Family Medicine

## 2024-01-24 VITALS — BP 116/70 | HR 76 | Ht 65.0 in | Wt 198.8 lb

## 2024-01-24 DIAGNOSIS — F32A Depression, unspecified: Secondary | ICD-10-CM | POA: Diagnosis not present

## 2024-01-24 DIAGNOSIS — Z6833 Body mass index (BMI) 33.0-33.9, adult: Secondary | ICD-10-CM | POA: Diagnosis not present

## 2024-01-24 DIAGNOSIS — F419 Anxiety disorder, unspecified: Secondary | ICD-10-CM | POA: Diagnosis not present

## 2024-01-24 NOTE — Progress Notes (Unsigned)
     Primary Care / Sports Medicine Office Visit  Patient Information:  Patient ID: Lynn Zuniga, female DOB: [birth year] Age: 38 y.o. MRN: [id number]   Lynn Zuniga is a pleasant 38 y.o. female presenting with the following:  Chief Complaint  Patient presents with   Depression    Vitals:   01/24/24 1351  BP: 116/70  Pulse: 76  SpO2: 99%   Vitals:   01/24/24 1351  Weight: 198 lb 12.8 oz (90.2 kg)  Height: 5\' 5"  (1.651 m)   Body mass index is 33.08 kg/m.  No results found.   Independent interpretation of notes and tests performed by another provider:   None  Procedures performed:   None  Pertinent History, Exam, Impression, and Recommendations:   Problem List Items Addressed This Visit     Anxiety and depression - Primary   She is currently taking Zoloft  100 mg and Auvelity  once daily in the morning. No dizziness or feeling off when she started Auvelity . She has not had any recent refills due to having a large supply from previous prescriptions.  Depression Symptoms well-controlled with current medications. Discussed increasing Auvelity  to improve energy and mood. - Increase Auvelity  to twice daily, ensuring doses are at least 8 hours apart. - Continue Zoloft  at current dose. - Monitor mood and energy levels over the next month and report via My Chart message. - Evaluate effectiveness of increased Auvelity  dose before the end of June.      BMI 33.0-33.9,adult   She has been trying to manage her weight and previously used semaglutide  injections, which are no longer available due to FDA regulations. She is concerned about the cost of alternative weight loss medications and is considering lifestyle changes to manage her weight. She has a history of participating in weight loss challenges and using peanut butter powder and Lily's chocolate as part of her diet.  BMI 33.08 Management complicated by semaglutide  discontinuation. Discussed alternative medications  and emphasized lifestyle modifications. - Implement lifestyle modifications focusing on diet, particularly reducing sweets intake. - Evaluate self-management progress over the summer. - Consider referral to Healthy Weight and Wellness program if self-management is insufficient. - Discuss potential use of naltrexone  with insurance coverage if applicable. - Schedule follow-up in 5 months to reassess weight management strategy.        Orders & Medications Medications: No orders of the defined types were placed in this encounter.  No orders of the defined types were placed in this encounter.    Return in about 5 months (around 06/25/2024) for CPE.     Ma Saupe, MD, [facility]   Primary Care Sports Medicine Primary Care and Sports Medicine at [facility]

## 2024-01-28 ENCOUNTER — Encounter: Payer: Self-pay | Admitting: Family Medicine

## 2024-01-28 DIAGNOSIS — Z6833 Body mass index (BMI) 33.0-33.9, adult: Secondary | ICD-10-CM | POA: Insufficient documentation

## 2024-01-28 NOTE — Assessment & Plan Note (Addendum)
 She has been trying to manage her weight and previously used semaglutide  injections, which are no longer available due to FDA regulations. She is concerned about the cost of alternative weight loss medications and is considering lifestyle changes to manage her weight. She has a history of participating in weight loss challenges and using peanut butter powder and Lily's chocolate as part of her diet.  BMI 33.08 Management complicated by semaglutide  discontinuation. Discussed alternative medications and emphasized lifestyle modifications. - Implement lifestyle modifications focusing on diet, particularly reducing sweets intake. - Evaluate self-management progress over the summer. - Consider referral to Healthy Weight and Wellness program if self-management is insufficient. - Discuss potential use of naltrexone  with insurance coverage if applicable. - Schedule follow-up in 5 months to reassess weight management strategy.

## 2024-01-28 NOTE — Assessment & Plan Note (Signed)
 She is currently taking Zoloft  100 mg and Auvelity  once daily in the morning. No dizziness or feeling off when she started Auvelity . She has not had any recent refills due to having a large supply from previous prescriptions.  Depression Symptoms well-controlled with current medications. Discussed increasing Auvelity  to improve energy and mood. - Increase Auvelity  to twice daily, ensuring doses are at least 8 hours apart. - Continue Zoloft  at current dose. - Monitor mood and energy levels over the next month and report via My Chart message. - Evaluate effectiveness of increased Auvelity  dose before the end of June.

## 2024-01-28 NOTE — Patient Instructions (Signed)
 Patient Action Plan  1. Anxiety and Depression Management:    - Increase Auvelity  to twice daily, with doses at least 8 hours apart.    - Continue taking Zoloft  at the current dose.    - Monitor your mood and energy levels over the next month and send a report via My Chart message.    - Evaluate the effectiveness of the increased Auvelity  dose before the end of June.  2. Weight Management:    - Focus on lifestyle modifications, particularly reducing sweets in your diet.    - Track your progress in self-managing your weight over the summer.    - Consider joining a Healthy Weight and Wellness program if self-management isn't enough.    - Discuss the potential use of naltrexone , if interested - check for insurance coverage.    - Schedule a follow-up appointment in 5 months to reassess your weight management plan.  Red Flags: Contact your healthcare provider if you experience any significant changes in mood, energy levels, or if you encounter difficulties with your weight management plan.

## 2024-01-30 DIAGNOSIS — G4733 Obstructive sleep apnea (adult) (pediatric): Secondary | ICD-10-CM | POA: Diagnosis not present

## 2024-03-01 DIAGNOSIS — G4733 Obstructive sleep apnea (adult) (pediatric): Secondary | ICD-10-CM | POA: Diagnosis not present

## 2024-03-02 ENCOUNTER — Encounter: Payer: Self-pay | Admitting: Obstetrics and Gynecology

## 2024-03-31 DIAGNOSIS — G4733 Obstructive sleep apnea (adult) (pediatric): Secondary | ICD-10-CM | POA: Diagnosis not present

## 2024-04-09 ENCOUNTER — Ambulatory Visit: Admitting: Obstetrics and Gynecology

## 2024-04-14 ENCOUNTER — Ambulatory Visit: Admitting: Obstetrics and Gynecology

## 2024-04-14 DIAGNOSIS — Z9889 Other specified postprocedural states: Secondary | ICD-10-CM

## 2024-04-29 DIAGNOSIS — G4733 Obstructive sleep apnea (adult) (pediatric): Secondary | ICD-10-CM | POA: Diagnosis not present

## 2024-05-01 DIAGNOSIS — G4733 Obstructive sleep apnea (adult) (pediatric): Secondary | ICD-10-CM | POA: Diagnosis not present

## 2024-05-10 ENCOUNTER — Encounter: Payer: Self-pay | Admitting: Family Medicine

## 2024-05-13 ENCOUNTER — Ambulatory Visit: Admitting: Obstetrics & Gynecology

## 2024-05-16 DIAGNOSIS — S161XXA Strain of muscle, fascia and tendon at neck level, initial encounter: Secondary | ICD-10-CM | POA: Diagnosis not present

## 2024-05-16 DIAGNOSIS — M542 Cervicalgia: Secondary | ICD-10-CM | POA: Diagnosis not present

## 2024-05-16 DIAGNOSIS — M25511 Pain in right shoulder: Secondary | ICD-10-CM | POA: Diagnosis not present

## 2024-05-16 DIAGNOSIS — R519 Headache, unspecified: Secondary | ICD-10-CM | POA: Diagnosis not present

## 2024-05-16 DIAGNOSIS — M25512 Pain in left shoulder: Secondary | ICD-10-CM | POA: Diagnosis not present

## 2024-05-18 ENCOUNTER — Ambulatory Visit (INDEPENDENT_AMBULATORY_CARE_PROVIDER_SITE_OTHER): Admitting: Family Medicine

## 2024-05-18 ENCOUNTER — Encounter: Payer: Self-pay | Admitting: Family Medicine

## 2024-05-18 VITALS — BP 112/76 | HR 95 | Ht 65.0 in | Wt 216.0 lb

## 2024-05-18 DIAGNOSIS — S134XXA Sprain of ligaments of cervical spine, initial encounter: Secondary | ICD-10-CM

## 2024-05-18 DIAGNOSIS — S060X0A Concussion without loss of consciousness, initial encounter: Secondary | ICD-10-CM | POA: Diagnosis not present

## 2024-05-18 MED ORDER — CELECOXIB 100 MG PO CAPS: 100.0000 mg | ORAL_CAPSULE | Freq: Two times a day (BID) | ORAL | 0 refills | Status: DC | PRN

## 2024-05-18 MED ORDER — TIZANIDINE HCL 2 MG PO TABS: 2.0000 mg | ORAL_TABLET | Freq: Four times a day (QID) | ORAL | 0 refills | Status: DC | PRN

## 2024-05-19 NOTE — Telephone Encounter (Signed)
 Please review.  KP

## 2024-05-20 ENCOUNTER — Encounter: Payer: Self-pay | Admitting: Family Medicine

## 2024-05-20 DIAGNOSIS — S134XXA Sprain of ligaments of cervical spine, initial encounter: Secondary | ICD-10-CM | POA: Insufficient documentation

## 2024-05-20 DIAGNOSIS — S060X0A Concussion without loss of consciousness, initial encounter: Secondary | ICD-10-CM | POA: Insufficient documentation

## 2024-05-20 NOTE — Telephone Encounter (Signed)
 Please review and advise patient.   JM
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
## 2024-05-20 NOTE — Patient Instructions (Signed)
 Patient Plan  - Rest and limit activities based on your symptoms. - Stay hydrated and engage in light physical activity as tolerated. - Avoid exposure to smoke. - Take tizanidine  as prescribed for muscle relaxation. - Use Celebrex  as prescribed for pain management. - Communicate with your insurance company and employer about your need for time off work. - Follow up in two weeks for reassessment.  Red flags - seek care right away if you notice:  - Worsening or severe headache - Repeated vomiting - New or worsening numbness, tingling, or weakness - Difficulty speaking, confusion, or trouble waking up - Loss of consciousness - Vision changes or double vision - Any new or concerning symptoms
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
## 2024-05-25 ENCOUNTER — Ambulatory Visit: Admitting: Obstetrics & Gynecology

## 2024-05-26 ENCOUNTER — Encounter: Payer: Self-pay | Admitting: Family Medicine

## 2024-05-26 ENCOUNTER — Ambulatory Visit (INDEPENDENT_AMBULATORY_CARE_PROVIDER_SITE_OTHER): Admitting: Family Medicine

## 2024-05-26 VITALS — Ht 65.0 in | Wt 211.0 lb

## 2024-05-26 DIAGNOSIS — S060X0D Concussion without loss of consciousness, subsequent encounter: Secondary | ICD-10-CM

## 2024-05-26 MED ORDER — CYCLOBENZAPRINE HCL 5 MG PO TABS: 5.0000 mg | ORAL_TABLET | Freq: Three times a day (TID) | ORAL | 0 refills | Status: DC | PRN
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
## 2024-06-01 ENCOUNTER — Ambulatory Visit: Admitting: Family Medicine

## 2024-06-01 ENCOUNTER — Encounter: Payer: Self-pay | Admitting: Obstetrics & Gynecology

## 2024-06-01 ENCOUNTER — Ambulatory Visit (INDEPENDENT_AMBULATORY_CARE_PROVIDER_SITE_OTHER): Admitting: Obstetrics & Gynecology

## 2024-06-01 VITALS — BP 132/82 | HR 80 | Ht 66.0 in | Wt 216.4 lb

## 2024-06-01 DIAGNOSIS — Z4889 Encounter for other specified surgical aftercare: Secondary | ICD-10-CM | POA: Diagnosis not present

## 2024-06-01 DIAGNOSIS — R6882 Decreased libido: Secondary | ICD-10-CM | POA: Diagnosis not present

## 2024-06-01 DIAGNOSIS — Z9889 Other specified postprocedural states: Secondary | ICD-10-CM

## 2024-06-01 NOTE — Progress Notes (Signed)
    GYNECOLOGY PROGRESS NOTE  Subjective:    Patient ID: Lynn Zuniga, female    DOB: 09/17/1985, 38 y.o.   MRN: [id number]  HPI  Patient is a 38 y.o.single  [id number] (16 and 41 yo daughters) here for 6 month follow up after having a LAVH by Dr. Janit. This was done for CIN 1 and irregular bleeding. She had previously tried an ablation but still had bleeding.  She reports that she has gained weight. However, when I look at Epic, she has actually lost 4 pounds. She reports decreased libido. She has been with her current partner prior to surgery. She reports that she has only tried sex once since surgery. She thinks it might have hurt a little bit. This was maybe over a month ago.  She denies any other problems. She is having normal bowel and bladder function.  The following portions of the patient's history were reviewed and updated as appropriate: allergies, current medications, past family history, past medical history, past social history, past surgical history, and problem list.  Review of Systems Pertinent items are noted in HPI.   Objective:   Height 5' 6 (1.676 m), weight 216 lb 6.4 oz (98.2 kg), last menstrual period 10/07/2023. Body mass index is 34.93 kg/m. Well nourished, well hydrated White female, no apparent distress She is ambulating and conversing normally.   Assessment:   Post op s/p LAVH with decreased libido. I have assured her that her ovaries are still intact and that the surgery should not affect libido.  I offered to refer her to psychology but she declines that at this time.  Plan:   As above

## 2024-06-09 ENCOUNTER — Encounter: Payer: Self-pay | Admitting: Family Medicine

## 2024-06-09 ENCOUNTER — Ambulatory Visit: Admitting: Family Medicine

## 2024-06-09 ENCOUNTER — Ambulatory Visit (INDEPENDENT_AMBULATORY_CARE_PROVIDER_SITE_OTHER): Admitting: Family Medicine

## 2024-06-09 VITALS — BP 98/66 | HR 88 | Ht 66.0 in | Wt 210.0 lb

## 2024-06-09 DIAGNOSIS — S060X0D Concussion without loss of consciousness, subsequent encounter: Secondary | ICD-10-CM | POA: Diagnosis not present

## 2024-06-09 MED ORDER — AMITRIPTYLINE HCL 25 MG PO TABS: 25.0000 mg | ORAL_TABLET | Freq: Every day | ORAL | 1 refills | Status: DC
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
## 2024-06-16 ENCOUNTER — Ambulatory Visit: Attending: Family Medicine

## 2024-06-16 DIAGNOSIS — R42 Dizziness and giddiness: Secondary | ICD-10-CM | POA: Insufficient documentation

## 2024-06-16 DIAGNOSIS — M542 Cervicalgia: Secondary | ICD-10-CM | POA: Insufficient documentation

## 2024-06-16 DIAGNOSIS — S060X0D Concussion without loss of consciousness, subsequent encounter: Secondary | ICD-10-CM | POA: Diagnosis not present
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
## 2024-06-18 ENCOUNTER — Ambulatory Visit

## 2024-06-18 DIAGNOSIS — R42 Dizziness and giddiness: Secondary | ICD-10-CM

## 2024-06-18 DIAGNOSIS — M542 Cervicalgia: Secondary | ICD-10-CM
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
## 2024-06-22 ENCOUNTER — Encounter: Payer: Self-pay | Admitting: Family Medicine

## 2024-06-22 NOTE — Telephone Encounter (Signed)
 Please review. JM

## 2024-06-23 ENCOUNTER — Ambulatory Visit

## 2024-06-23 DIAGNOSIS — S060X0D Concussion without loss of consciousness, subsequent encounter: Secondary | ICD-10-CM | POA: Diagnosis not present

## 2024-06-23 DIAGNOSIS — R42 Dizziness and giddiness: Secondary | ICD-10-CM

## 2024-06-23 DIAGNOSIS — M542 Cervicalgia: Secondary | ICD-10-CM

## 2024-06-25 ENCOUNTER — Ambulatory Visit

## 2024-06-25 DIAGNOSIS — R42 Dizziness and giddiness: Secondary | ICD-10-CM

## 2024-06-25 DIAGNOSIS — M542 Cervicalgia: Secondary | ICD-10-CM

## 2024-06-25 DIAGNOSIS — S060X0D Concussion without loss of consciousness, subsequent encounter: Secondary | ICD-10-CM | POA: Diagnosis not present
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
## 2024-06-26 ENCOUNTER — Ambulatory Visit (INDEPENDENT_AMBULATORY_CARE_PROVIDER_SITE_OTHER): Admitting: Family Medicine

## 2024-06-26 ENCOUNTER — Encounter: Payer: Self-pay | Admitting: Family Medicine

## 2024-06-26 VITALS — BP 96/64 | HR 72 | Temp 97.6°F | Ht 66.0 in | Wt 217.2 lb

## 2024-06-26 DIAGNOSIS — D649 Anemia, unspecified: Secondary | ICD-10-CM | POA: Diagnosis not present

## 2024-06-26 DIAGNOSIS — G4709 Other insomnia: Secondary | ICD-10-CM | POA: Diagnosis not present

## 2024-06-26 DIAGNOSIS — G4733 Obstructive sleep apnea (adult) (pediatric): Secondary | ICD-10-CM | POA: Diagnosis not present

## 2024-06-26 DIAGNOSIS — Z0000 Encounter for general adult medical examination without abnormal findings: Secondary | ICD-10-CM | POA: Diagnosis not present

## 2024-06-26 DIAGNOSIS — F419 Anxiety disorder, unspecified: Secondary | ICD-10-CM | POA: Diagnosis not present

## 2024-06-26 DIAGNOSIS — S060X0D Concussion without loss of consciousness, subsequent encounter: Secondary | ICD-10-CM | POA: Diagnosis not present

## 2024-06-26 DIAGNOSIS — F32A Depression, unspecified: Secondary | ICD-10-CM

## 2024-06-26 DIAGNOSIS — Z23 Encounter for immunization: Secondary | ICD-10-CM | POA: Diagnosis not present

## 2024-06-26 MED ORDER — TIRZEPATIDE-WEIGHT MANAGEMENT 2.5 MG/0.5ML ~~LOC~~ SOLN: 2.5000 mg | SUBCUTANEOUS | 0 refills | Status: DC

## 2024-06-26 MED ORDER — SERTRALINE HCL 100 MG PO TABS: 100.0000 mg | ORAL_TABLET | Freq: Every day | ORAL | 2 refills | Status: DC

## 2024-06-26 NOTE — Assessment & Plan Note (Signed)
 Insomnia Difficulty sleeping, possibly due to concussion and mood disorder. Amitriptyline provides some benefit. - Encourage consistent amitriptyline use for sleep and headache. - Consider increasing amitriptyline to 50 mg if beneficial. - Monitor sleep patterns, report ongoing issues.

## 2024-06-26 NOTE — Patient Instructions (Addendum)
-   Obtain fasting labs with orders provided (can have water or black coffee but otherwise no food or drink x 8 hours before labs) - Review information provided - Attend eye doctor annually, dentist every 6 months, work towards or maintain 30 minutes of moderate intensity physical activity at least 5 days per week, and consume a balanced diet - Return in 1 year for physical - Contact us  for any questions between now and then   Patient Instructions  Anemia: - Complete the ordered blood tests: iron, TIBC, and ferritin panel. - Follow up for results and further instructions once labs are available.  Anxiety and depression: - Take sertraline  (Zoloft ) every morning as prescribed. - Take dextromethorphan/bupropion (Auvelity ) consistently, ideally twice daily. - Monitor your mood and report any significant changes. - If mood does not improve, discuss the option of adding amitriptyline at night.  Concussion recovery: - Monitor for any return or worsening of concussion symptoms. - Reduce activities if symptoms flare up. - Consider amitriptyline at night if you have headaches or trouble sleeping. - Insomnia: - Use amitriptyline at night as directed for sleep and headache relief. - If helpful, you may increase the dose to 50 mg at night. - Track your sleep patterns and report ongoing sleep problems.  Obstructive sleep apnea: - Use your CPAP machine regularly. - Await the arrival of your tirzepatide (Zepbound) medication. Contact the office once received to discuss starting and adjusting the dose. - Request your sleep study report from [facility] if not already done.  Red flags - seek medical attention if you experience:  - Severe or worsening headaches, confusion, or vision changes - New or worsening shortness of breath or chest pain - Suicidal thoughts or severe mood changes - Unusual bleeding, bruising, or signs of severe anemia (such as extreme fatigue or paleness)  If you have questions or  your symptoms do not improve, schedule a follow-up appointment.
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
## 2024-06-26 NOTE — Assessment & Plan Note (Signed)
 Obstructive sleep apnea CPAP use. Discussed adjunct treatments and potential Zepbound benefits. - Encourage regular CPAP use. - Request sleep study report from [place]. - Send Zepbound prescription. - Await starting medication once received, contact our office.

## 2024-06-26 NOTE — Assessment & Plan Note (Signed)
 Annual examination completed, risk stratification labs ordered, anticipatory guidance provided.  We will follow labs once resulted.
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
## 2024-06-26 NOTE — Assessment & Plan Note (Signed)
 Psychiatric symptoms - Currently taking Zoloft  in the morning and Auvelity  in the morning, though not consistently with Auvelity  PM dosing  Behavioral Health Managed with Zoloft  and Auvelity . Issues with Auvelity  adherence. - Encourage consistent Auvelity  use, ideally twice daily. - Continue Zoloft  as prescribed. - Monitor mood, report changes. - Consider adding amitriptyline nightly if mood does not improve.

## 2024-06-27 LAB — COMPREHENSIVE METABOLIC PANEL WITH GFR
ALT: 22 IU/L (ref 0–32)
AST: 23 IU/L (ref 0–40)
Albumin: 4.1 g/dL (ref 3.9–4.9)
Alkaline Phosphatase: 146 IU/L — ABNORMAL HIGH (ref 41–116)
BUN/Creatinine Ratio: 15 (ref 9–23)
BUN: 12 mg/dL (ref 6–20)
Bilirubin Total: 0.3 mg/dL (ref 0.0–1.2)
CO2: 25 mmol/L (ref 20–29)
Calcium: 9 mg/dL (ref 8.7–10.2)
Chloride: 101 mmol/L (ref 96–106)
Creatinine, Ser: 0.78 mg/dL (ref 0.57–1.00)
Globulin, Total: 2.7 g/dL (ref 1.5–4.5)
Glucose: 81 mg/dL (ref 70–99)
Potassium: 4.5 mmol/L (ref 3.5–5.2)
Sodium: 138 mmol/L (ref 134–144)
Total Protein: 6.8 g/dL (ref 6.0–8.5)
eGFR: 100 mL/min/1.73 (ref >59)

## 2024-06-27 LAB — HEMOGLOBIN A1C
Est. average glucose Bld gHb Est-mCnc: 117 mg/dL
Hgb A1c MFr Bld: 5.7 % — ABNORMAL HIGH (ref 4.8–5.6)

## 2024-06-27 LAB — CBC
Hematocrit: 39 % (ref 34.0–46.6)
Hemoglobin: 12.4 g/dL (ref 11.1–15.9)
MCH: 29 pg (ref 26.6–33.0)
MCHC: 31.8 g/dL (ref 31.5–35.7)
MCV: 91 fL (ref 79–97)
Platelets: 318 x10E3/uL (ref 150–450)
RBC: 4.27 x10E6/uL (ref 3.77–5.28)
RDW: 13.5 % (ref 11.7–15.4)
WBC: 8.8 x10E3/uL (ref 3.4–10.8)

## 2024-06-27 LAB — IRON,TIBC AND FERRITIN PANEL
Ferritin: 53 ng/mL (ref 15–150)
Iron Saturation: 12 % — ABNORMAL LOW (ref 15–55)
Iron: 46 ug/dL (ref 27–159)
Total Iron Binding Capacity: 369 ug/dL (ref 250–450)
UIBC: 323 ug/dL (ref 131–425)

## 2024-06-27 LAB — LIPID PANEL
Chol/HDL Ratio: 2.5 ratio (ref 0.0–4.4)
Cholesterol, Total: 150 mg/dL (ref 100–199)
HDL: 59 mg/dL (ref >39)
LDL Chol Calc (NIH): 76 mg/dL (ref 0–99)
Triglycerides: 75 mg/dL (ref 0–149)
VLDL Cholesterol Cal: 15 mg/dL (ref 5–40)

## 2024-06-29 ENCOUNTER — Ambulatory Visit: Payer: Self-pay | Admitting: Family Medicine

## 2024-06-30 ENCOUNTER — Ambulatory Visit

## 2024-06-30 DIAGNOSIS — M542 Cervicalgia: Secondary | ICD-10-CM | POA: Diagnosis not present

## 2024-06-30 DIAGNOSIS — S060X0D Concussion without loss of consciousness, subsequent encounter: Secondary | ICD-10-CM | POA: Diagnosis not present

## 2024-06-30 DIAGNOSIS — R42 Dizziness and giddiness: Secondary | ICD-10-CM

## 2024-07-02 ENCOUNTER — Ambulatory Visit

## 2024-07-02 DIAGNOSIS — R42 Dizziness and giddiness: Secondary | ICD-10-CM

## 2024-07-02 DIAGNOSIS — M542 Cervicalgia: Secondary | ICD-10-CM

## 2024-07-02 DIAGNOSIS — S060X0D Concussion without loss of consciousness, subsequent encounter: Secondary | ICD-10-CM | POA: Diagnosis not present
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
## 2024-07-06 ENCOUNTER — Ambulatory Visit
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
## 2024-07-08 ENCOUNTER — Ambulatory Visit: Attending: Family Medicine

## 2024-07-08 DIAGNOSIS — R42 Dizziness and giddiness: Secondary | ICD-10-CM | POA: Diagnosis not present

## 2024-07-08 DIAGNOSIS — M542 Cervicalgia: Secondary | ICD-10-CM | POA: Diagnosis not present

## 2024-07-13 ENCOUNTER — Encounter

## 2024-07-15 ENCOUNTER — Ambulatory Visit

## 2024-07-21 ENCOUNTER — Ambulatory Visit

## 2024-07-23 ENCOUNTER — Ambulatory Visit

## 2024-07-23 DIAGNOSIS — R42 Dizziness and giddiness: Secondary | ICD-10-CM | POA: Diagnosis not present

## 2024-07-23 DIAGNOSIS — M542 Cervicalgia: Secondary | ICD-10-CM
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
## 2024-07-28 DIAGNOSIS — G4733 Obstructive sleep apnea (adult) (pediatric): Secondary | ICD-10-CM | POA: Diagnosis not present

## 2024-08-17 ENCOUNTER — Encounter: Payer: Self-pay | Admitting: Family Medicine

## 2024-08-17 ENCOUNTER — Other Ambulatory Visit: Payer: Self-pay

## 2024-08-17 DIAGNOSIS — Z6833 Body mass index (BMI) 33.0-33.9, adult: Secondary | ICD-10-CM

## 2024-08-17 MED ORDER — TIRZEPATIDE-WEIGHT MANAGEMENT 2.5 MG/0.5ML ~~LOC~~ SOLN: 2.5000 mg | SUBCUTANEOUS | 0 refills | Status: AC

## 2024-08-31 ENCOUNTER — Other Ambulatory Visit: Payer: Self-pay

## 2024-08-31 DIAGNOSIS — F32A Depression, unspecified: Secondary | ICD-10-CM

## 2024-08-31 DIAGNOSIS — F321 Major depressive disorder, single episode, moderate: Secondary | ICD-10-CM

## 2024-08-31 MED ORDER — AMITRIPTYLINE HCL 25 MG PO TABS: 25.0000 mg | ORAL_TABLET | Freq: Every day | ORAL | 1 refills | Status: DC

## 2024-08-31 MED ORDER — SERTRALINE HCL 100 MG PO TABS: 100.0000 mg | ORAL_TABLET | Freq: Every day | ORAL | 2 refills | Status: AC

## 2024-09-02 ENCOUNTER — Ambulatory Visit (INDEPENDENT_AMBULATORY_CARE_PROVIDER_SITE_OTHER)

## 2024-09-02 ENCOUNTER — Other Ambulatory Visit (HOSPITAL_COMMUNITY)
Admission: RE | Admit: 2024-09-02 | Discharge: 2024-09-02 | Disposition: A | Discharge status: Home / Self Care | Source: Ambulatory Visit | Attending: Obstetrics | Admitting: Obstetrics

## 2024-09-02 VITALS — BP 110/71 | HR 93 | Ht 62.0 in | Wt 204.0 lb

## 2024-09-02 DIAGNOSIS — R102 Pelvic and perineal pain unspecified side: Secondary | ICD-10-CM | POA: Diagnosis not present

## 2024-09-02 DIAGNOSIS — N898 Other specified noninflammatory disorders of vagina: Secondary | ICD-10-CM | POA: Insufficient documentation

## 2024-09-02 LAB — POCT URINALYSIS DIPSTICK
Bilirubin, UA: NEGATIVE
Blood, UA: POSITIVE
Glucose, UA: NEGATIVE
Ketones, UA: NEGATIVE
Leukocytes, UA: NEGATIVE
Nitrite, UA: NEGATIVE
Protein, UA: NEGATIVE
Spec Grav, UA: 1.01
Urobilinogen, UA: 0.2 U/dL
pH, UA: 5.5

## 2024-09-02 NOTE — Progress Notes (Signed)
" ° ° °  NURSE VISIT NOTE  Subjective:    Patient ID: Lynn Zuniga, female    DOB: 1986/06/18, 38 y.o.   MRN: [id number]  HPI  Patient is a 38 y.o. G51P2012 female who presents for yellow sour vaginal discharge for 2 day(s). Denies abnormal vaginal bleeding. Has  significant pelvic pain . denies dysuria, hematuria, urinary frequency, urinary urgency, flank pain, cloudy malordorous urine, and genital rash. Patient denies a history of known exposure to STD.   Objective:    BP 110/71   Pulse 93   Wt 204 lb (92.5 kg)   LMP 10/07/2023   BMI 32.93 kg/m    No results found for any visits on 09/02/24.  Assessment:   1. Vaginal discharge   2. Pelvic pain       Plan:   GC and chlamydia DNA  probe sent to lab. Treatment: abstain from coitus during course of treatment ROV prn if symptoms persist or worsen.   Harlene Gander, CMA  "

## 2024-09-04 LAB — URINE CULTURE

## 2024-09-07 ENCOUNTER — Encounter: Payer: Self-pay | Admitting: Obstetrics

## 2024-09-07 LAB — CERVICOVAGINAL ANCILLARY ONLY
Bacterial Vaginitis (gardnerella): POSITIVE — AB
Candida Glabrata: NEGATIVE
Candida Vaginitis: NEGATIVE
Chlamydia: NEGATIVE
Comment: NEGATIVE
Comment: NEGATIVE
Comment: NEGATIVE
Comment: NEGATIVE
Comment: NEGATIVE
Comment: NORMAL
Neisseria Gonorrhea: NEGATIVE
Trichomonas: POSITIVE — AB

## 2024-09-08 ENCOUNTER — Ambulatory Visit: Payer: Self-pay

## 2024-09-08 ENCOUNTER — Telehealth: Payer: Self-pay

## 2024-09-08 DIAGNOSIS — A599 Trichomoniasis, unspecified: Secondary | ICD-10-CM

## 2024-09-08 DIAGNOSIS — B9689 Other specified bacterial agents as the cause of diseases classified elsewhere: Secondary | ICD-10-CM

## 2024-09-08 MED ORDER — METRONIDAZOLE 500 MG PO TABS: 500.0000 mg | ORAL_TABLET | Freq: Two times a day (BID) | ORAL | 0 refills | Status: DC

## 2024-09-08 NOTE — Telephone Encounter (Signed)
 Patient contacted through mychart with recent results of vaginal swab, antibiotic has been sent to pharmacy. KW

## 2024-09-10 ENCOUNTER — Other Ambulatory Visit: Payer: Self-pay

## 2024-09-10 DIAGNOSIS — F419 Anxiety disorder, unspecified: Secondary | ICD-10-CM

## 2024-09-10 MED ORDER — AUVELITY 45-105 MG PO TBCR: 1.0000 | EXTENDED_RELEASE_TABLET | Freq: Two times a day (BID) | ORAL | 1 refills | Status: AC

## 2024-09-22 ENCOUNTER — Ambulatory Visit (INDEPENDENT_AMBULATORY_CARE_PROVIDER_SITE_OTHER): Admitting: Family Medicine

## 2024-09-22 ENCOUNTER — Encounter: Payer: Self-pay | Admitting: Family Medicine

## 2024-09-22 VITALS — BP 106/68 | HR 83 | Temp 98.4°F | Ht 62.0 in | Wt 204.0 lb

## 2024-09-22 DIAGNOSIS — J00 Acute nasopharyngitis [common cold]: Secondary | ICD-10-CM

## 2024-09-22 LAB — POC COVID19 BINAXNOW: SARS Coronavirus 2 Ag: NEGATIVE

## 2024-09-22 LAB — POCT INFLUENZA A/B
Influenza A, POC: NEGATIVE
Influenza B, POC: NEGATIVE

## 2024-09-22 MED ORDER — PROMETHAZINE-DM 6.25-15 MG/5ML PO SYRP: 5.0000 mL | ORAL_SOLUTION | Freq: Four times a day (QID) | ORAL | 0 refills | Status: AC | PRN

## 2024-09-22 MED ORDER — MOMETASONE FUROATE 50 MCG/ACT NA SUSP: NASAL | 0 refills | Status: AC

## 2024-09-22 NOTE — Patient Instructions (Signed)
" °  VISIT SUMMARY: Today you were seen for a cough, chest discomfort, and difficulty breathing that started two days ago. Your symptoms are likely due to a viral infection, and your recent tests for influenza and COVID-19 were negative.  YOUR PLAN: ACUTE NASOPHARYNGITIS (COMMON COLD): You have a viral infection causing a cough, chest discomfort, mild difficulty breathing, and intermittent chills. -Take promethazine -dextromethorphan at bedtime to help suppress your cough. -Use guaifenesin to help thin mucus. -Use fluticasone nasal spray, or mometasone  if it is covered by your insurance. -Take cetirizine at night for allergy symptoms. -Consider taking vitamin C, vitamin D , and zinc to support your immune system. -Rest and take time off work if needed. We can provide documentation for your work absence. -Contact our office if your symptoms persist or worsen by next week. We may consider antibiotics if there is no improvement. -Remember that antibiotics are not helpful for viral infections and can have side effects.    Contains text generated by Abridge.   "

## 2024-09-22 NOTE — Progress Notes (Signed)
 "    Primary Care / Sports Medicine Office Visit  Patient Information:  Patient ID: Lynn Zuniga, female DOB: [birth year] Age: 39 y.o. MRN: [id number]   Lynn Zuniga is a pleasant 39 y.o. female presenting with the following:  Chief Complaint  Patient presents with   Cough    Cough, congestion x 2 days.     Vitals:   09/22/24 1110  BP: 106/68  Pulse: 83  Temp: 98.4 F (36.9 C)  SpO2: 96%   Vitals:   09/22/24 1110  Weight: 204 lb (92.5 kg)  Height: 5' 2 (1.575 m)   Body mass index is 37.31 kg/m.  No results found.   Independent interpretation of notes and tests performed by another provider:   None  Procedures performed:   None  Pertinent History, Exam, Impression, and Recommendations:   Discussed the use of AI scribe software for clinical note transcription with the patient, who gave verbal consent to proceed.  History of Present Illness   Lynn Zuniga is a 39 year old female with obesity who presents with acute onset cough, chest discomfort, and dyspnea.  Respiratory and Upper Airway Symptoms: - Acute onset of dry, non-productive cough provoking throat irritation and a sensation of throat rolling - Chest discomfort and dyspnea, described as similar to exertion - Chest pain is localized without radiation - Nasal and sinus pressure occurs with frequent coughing but is not persistent - No otologic symptoms  Symptom Trajectory and Associated Features: - Symptoms began two days prior to presentation - Severity fluctuates throughout the day, with worsening in the evening and nighttime - Experiences nocturnal chills and intermittent subjective fevers, described as burning up during the day, though no documented fever  Prior Episodes and Exposures: - Similar episodes have occurred twice previously, managed at home - Children are asymptomatic - Recent influenza and COVID-19 tests were negative  Therapeutic Interventions: - Mucinex used in the  morning, alleviates head pressure but does not improve cough - Zyrtec typically taken at night - Flonase nasal spray available at home but not used recently     Physical Exam VITALS: SaO2- 96% HEENT: Nasopharynx with mild swelling and minimal erythema. Sinuses non-tender. Oropharynx with subtle cobblestone pattern. Tympanic membranes benign bilaterally. CHEST: Lungs clear to auscultation bilaterally.  Assessment and Plan    Acute nasopharyngitis (common cold) Acute viral nasopharyngitis with cough, chest discomfort, mild dyspnea, and intermittent chills. Negative influenza and COVID-19 tests. Viral etiology expected to resolve with supportive care. Antibiotics not indicated. - Prescribed promethazine -dextromethorphan for cough suppression at bedtime. - Recommended guaifenesin for mucolysis. - Advised fluticasone nasal spray; prescribed mometasone  if covered by insurance. - Recommended cetirizine for allergy symptoms at night. - Suggested vitamin C, vitamin D , and zinc supplementation for immune support. - Advised rest and work absence as needed; offered documentation for work absence. - Instructed to contact office if symptoms persist or worsen by next week; will consider antibiotics if no improvement. - Provided guidance on limited role of antibiotics in viral infections and potential adverse effects.        Assessment & Plan Nasopharyngitis  Orders:   POCT Influenza A/B   POC COVID-19 BinaxNow   promethazine -dextromethorphan (PROMETHAZINE -DM) 6.25-15 MG/5ML syrup; Take 5 mLs by mouth 4 (four) times daily as needed for cough.   mometasone  (NASONEX ) 50 MCG/ACT nasal spray; One spray in each nostril twice a day, use left hand for right nostril, and right hand for left nostril.  Please dispense one bottle.  No follow-ups on file.     Lynn JINNY Ku, MD, [facility]   Primary Care Sports Medicine Primary Care and Sports Medicine at [facility]   "

## 2025-06-28 ENCOUNTER — Encounter: Admitting: Family Medicine
